# Patient Record
Sex: Female | Born: 1935 | Race: White | Hispanic: No | Smoking: Never smoker
Health system: Southern US, Community
[De-identification: ages and names within clinical notes are randomized; demographics above are authoritative.]

## PROBLEM LIST (undated history)

## (undated) DIAGNOSIS — I4891 Unspecified atrial fibrillation: Secondary | ICD-10-CM

## (undated) DIAGNOSIS — E039 Hypothyroidism, unspecified: Secondary | ICD-10-CM

## (undated) DIAGNOSIS — I10 Essential (primary) hypertension: Secondary | ICD-10-CM

## (undated) DIAGNOSIS — I5022 Chronic systolic (congestive) heart failure: Secondary | ICD-10-CM

## (undated) HISTORY — PX: LUMBAR FUSION: SHX111

## (undated) HISTORY — DX: Essential (primary) hypertension: I10

## (undated) HISTORY — DX: Hypothyroidism, unspecified: E03.9

## (undated) HISTORY — DX: Unspecified atrial fibrillation: I48.91

## (undated) HISTORY — PX: THYROIDECTOMY: SHX17

## (undated) HISTORY — PX: OTHER SURGICAL HISTORY: SHX169

## (undated) HISTORY — DX: Chronic systolic (congestive) heart failure: I50.22

---

## 2006-06-06 ENCOUNTER — Inpatient Hospital Stay (HOSPITAL_COMMUNITY): Admission: EM | Admit: 2006-06-06 | Discharge: 2006-06-15 | Payer: Self-pay | Admitting: Emergency Medicine

## 2006-06-13 ENCOUNTER — Encounter (INDEPENDENT_AMBULATORY_CARE_PROVIDER_SITE_OTHER): Payer: Self-pay | Admitting: *Deleted

## 2006-06-14 ENCOUNTER — Ambulatory Visit: Payer: Self-pay | Admitting: Internal Medicine

## 2007-10-02 ENCOUNTER — Ambulatory Visit: Payer: Self-pay | Admitting: Cardiology

## 2007-10-02 ENCOUNTER — Encounter: Payer: Self-pay | Admitting: Cardiology

## 2007-10-21 ENCOUNTER — Encounter: Payer: Self-pay | Admitting: Cardiology

## 2007-12-02 ENCOUNTER — Encounter: Payer: Self-pay | Admitting: Cardiology

## 2007-12-03 ENCOUNTER — Encounter: Payer: Self-pay | Admitting: Cardiology

## 2007-12-30 ENCOUNTER — Ambulatory Visit: Payer: Self-pay | Admitting: Cardiology

## 2007-12-31 ENCOUNTER — Ambulatory Visit: Payer: Self-pay | Admitting: Cardiology

## 2008-01-13 ENCOUNTER — Encounter: Payer: Self-pay | Admitting: Cardiology

## 2008-01-14 ENCOUNTER — Ambulatory Visit: Payer: Self-pay | Admitting: Cardiology

## 2008-01-27 ENCOUNTER — Ambulatory Visit: Payer: Self-pay | Admitting: Cardiology

## 2008-02-04 ENCOUNTER — Ambulatory Visit: Payer: Self-pay | Admitting: Cardiology

## 2008-02-06 ENCOUNTER — Ambulatory Visit: Payer: Self-pay | Admitting: Cardiology

## 2008-02-17 ENCOUNTER — Ambulatory Visit: Payer: Self-pay | Admitting: Cardiology

## 2008-02-18 ENCOUNTER — Ambulatory Visit: Payer: Self-pay | Admitting: Cardiology

## 2008-02-25 ENCOUNTER — Ambulatory Visit: Payer: Self-pay | Admitting: Cardiology

## 2008-03-13 ENCOUNTER — Ambulatory Visit: Payer: Self-pay | Admitting: Cardiology

## 2008-03-27 ENCOUNTER — Ambulatory Visit: Payer: Self-pay | Admitting: Cardiology

## 2008-04-13 ENCOUNTER — Encounter: Payer: Self-pay | Admitting: Cardiology

## 2008-04-17 ENCOUNTER — Ambulatory Visit: Payer: Self-pay | Admitting: Cardiology

## 2008-05-15 ENCOUNTER — Ambulatory Visit: Payer: Self-pay | Admitting: Cardiology

## 2008-05-29 ENCOUNTER — Ambulatory Visit: Payer: Self-pay | Admitting: Cardiology

## 2008-06-19 ENCOUNTER — Ambulatory Visit: Payer: Self-pay | Admitting: Cardiology

## 2008-07-03 ENCOUNTER — Ambulatory Visit: Payer: Self-pay | Admitting: Cardiology

## 2008-07-06 ENCOUNTER — Ambulatory Visit: Payer: Self-pay | Admitting: Cardiology

## 2008-07-21 ENCOUNTER — Ambulatory Visit: Payer: Self-pay | Admitting: Cardiology

## 2008-08-04 ENCOUNTER — Ambulatory Visit: Payer: Self-pay | Admitting: Cardiology

## 2008-08-18 ENCOUNTER — Ambulatory Visit: Payer: Self-pay | Admitting: Cardiology

## 2008-09-08 ENCOUNTER — Ambulatory Visit: Payer: Self-pay | Admitting: Cardiology

## 2008-10-02 ENCOUNTER — Ambulatory Visit: Payer: Self-pay | Admitting: Cardiology

## 2008-10-19 ENCOUNTER — Encounter: Payer: Self-pay | Admitting: *Deleted

## 2008-10-30 ENCOUNTER — Ambulatory Visit: Payer: Self-pay | Admitting: Internal Medicine

## 2008-11-03 DIAGNOSIS — I5042 Chronic combined systolic (congestive) and diastolic (congestive) heart failure: Secondary | ICD-10-CM | POA: Insufficient documentation

## 2008-11-03 DIAGNOSIS — I4891 Unspecified atrial fibrillation: Secondary | ICD-10-CM | POA: Insufficient documentation

## 2008-11-04 ENCOUNTER — Encounter: Payer: Self-pay | Admitting: Cardiology

## 2008-11-12 ENCOUNTER — Ambulatory Visit: Payer: Self-pay | Admitting: Cardiology

## 2008-11-27 ENCOUNTER — Ambulatory Visit: Payer: Self-pay | Admitting: Cardiology

## 2008-11-27 LAB — CONVERTED CEMR LAB: POC INR: 2.4

## 2009-01-01 ENCOUNTER — Ambulatory Visit: Payer: Self-pay | Admitting: Cardiology

## 2009-01-01 LAB — CONVERTED CEMR LAB: POC INR: 1.5

## 2009-01-12 ENCOUNTER — Ambulatory Visit: Payer: Self-pay | Admitting: Cardiology

## 2009-01-29 IMAGING — CR DG CHEST 2V
2 series · 2 of 2 positions shown · non-contrast
Comparison: Abdominal CT 06/06/06.

CLINICAL DATA: 89-year-old with nausea and abdominal pain. 
CHEST - 2 VIEW:

[w chest lat]
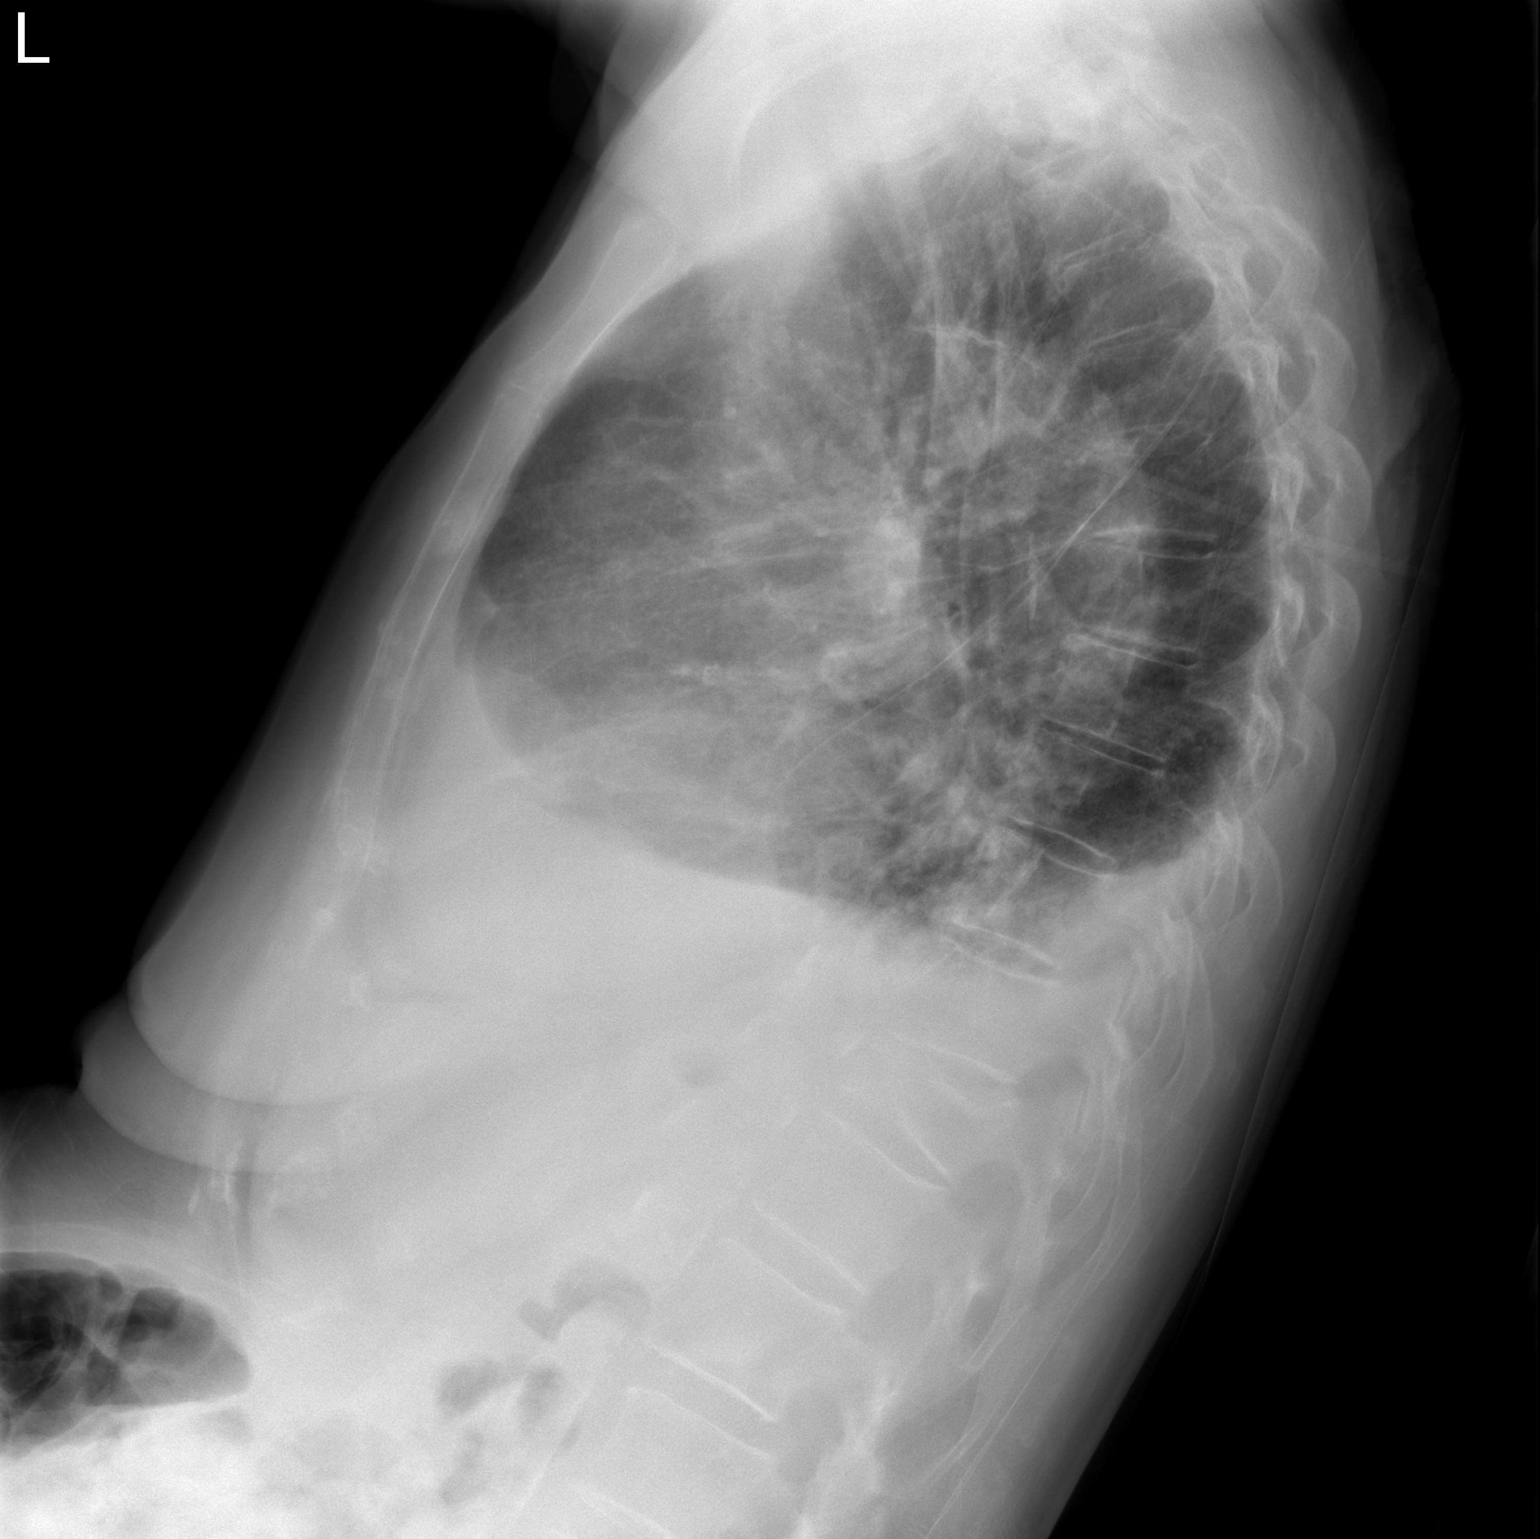

[view not recorded]
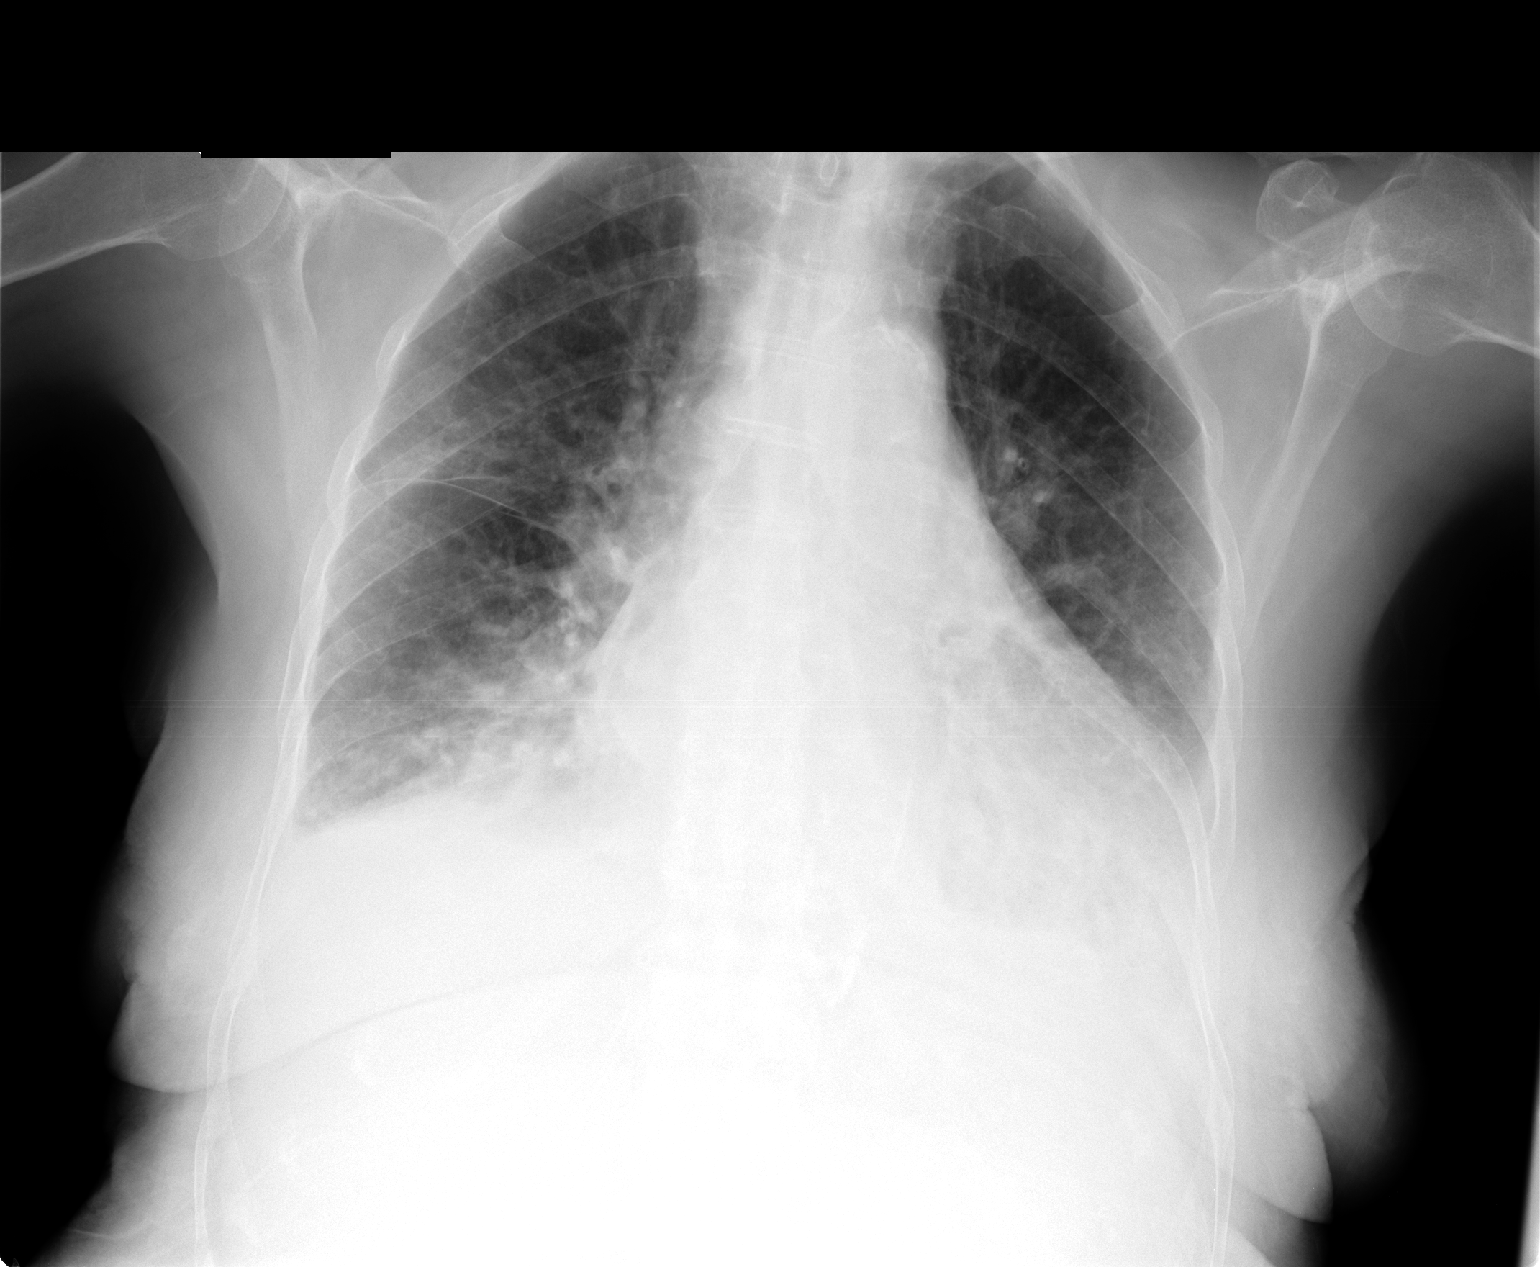

[2 of 2 positions shown; findings below may reference images not displayed]

FINDINGS: Two views of the chest demonstrate bilateral pleural effusions.  Interstitial markings are prominent and there is suggestion for some edema.  Heart size is also slightly enlarged.  Slightly increased density in the right upper lobe worrisome for a focus of infection.   There is a compression fracture involving T12.  Trachea is midline.
IMPRESSION: 1.  Bilateral pleural effusions and probable edema.  Interstitial and airspace disease in the right upper lung concerning for an area of infection and based on prior CT findings multifocal pneumonia cannot be excluded.
2.  Compression fracture involving T12.

## 2009-02-05 IMAGING — XA IR DG VERTEBROPLASTY FL
1 series · 9 of 9 positions shown · non-contrast
Comparison: none

CLINICAL DATA: Painful thoracic compression fracture.

[Series 1000: run · 0.08mm/px · 9 of 9 slices shown]
[im 1/9]
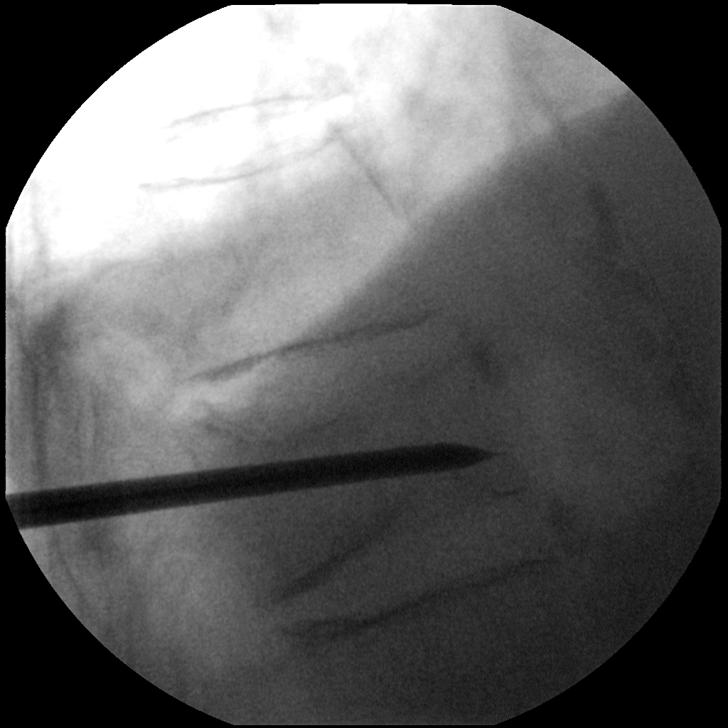
[im 2/9]
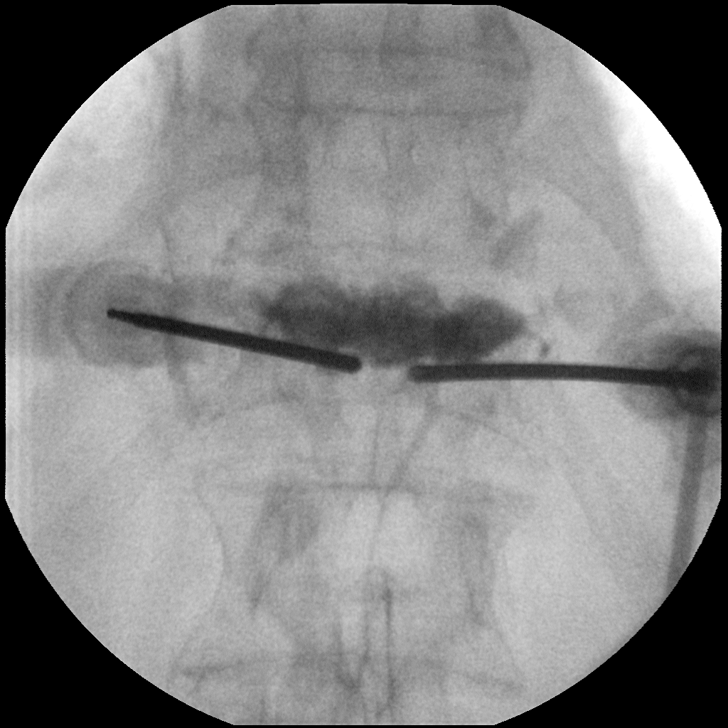
[im 3/9]
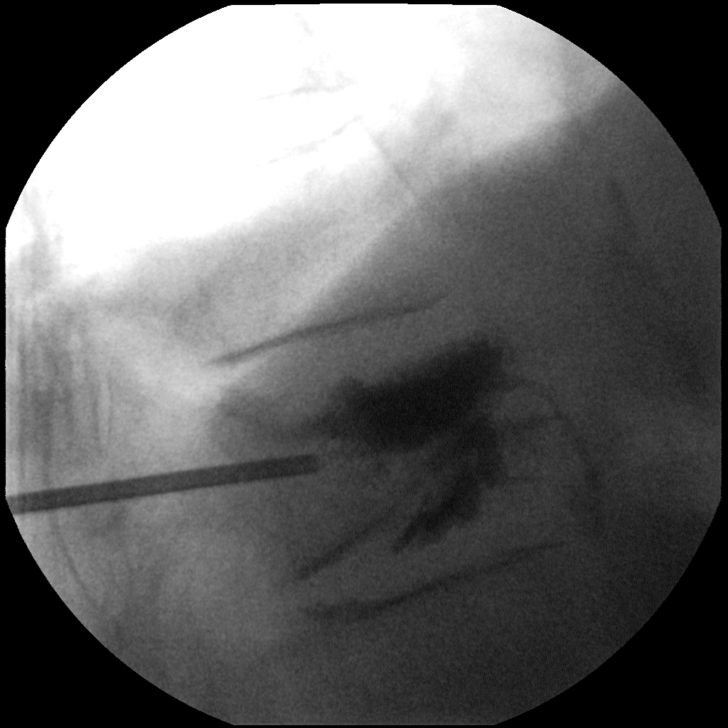
[im 4/9]
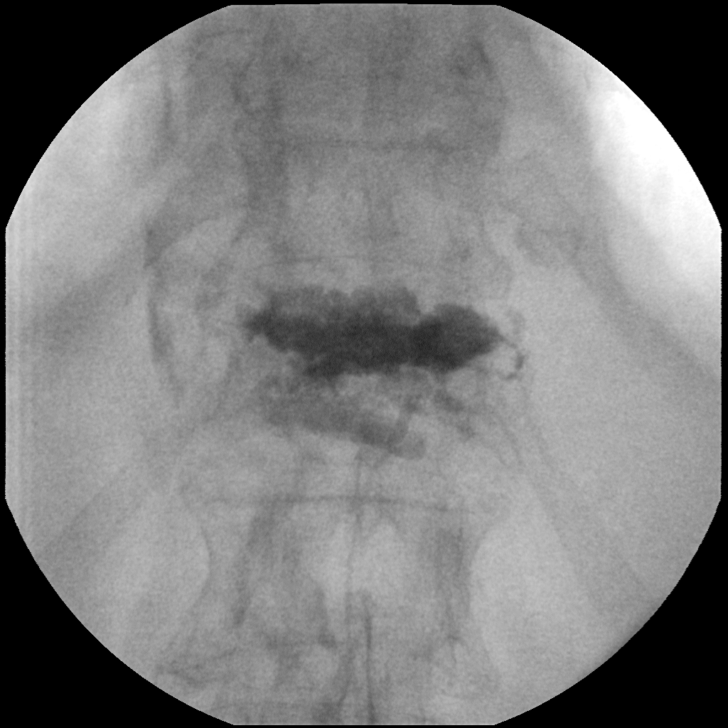
[im 5/9]
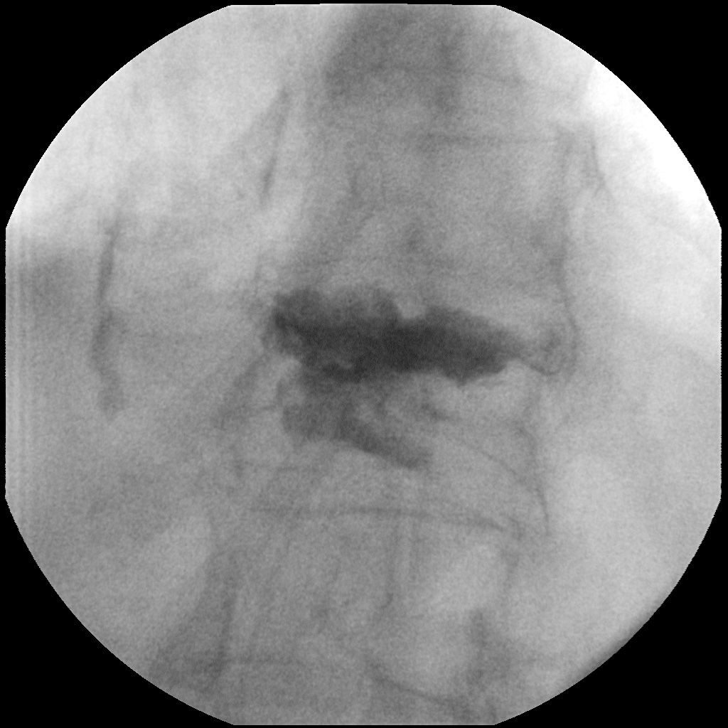
[im 6/9]
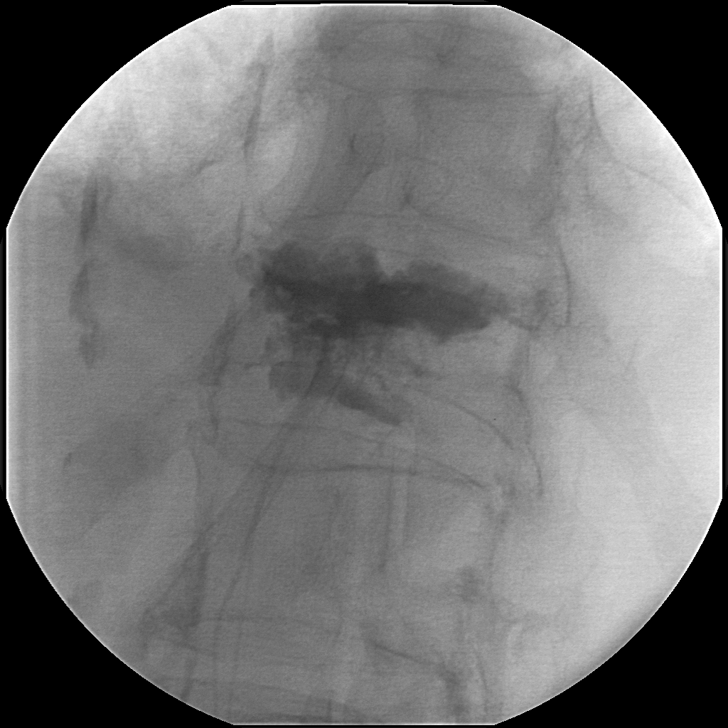
[im 7/9]
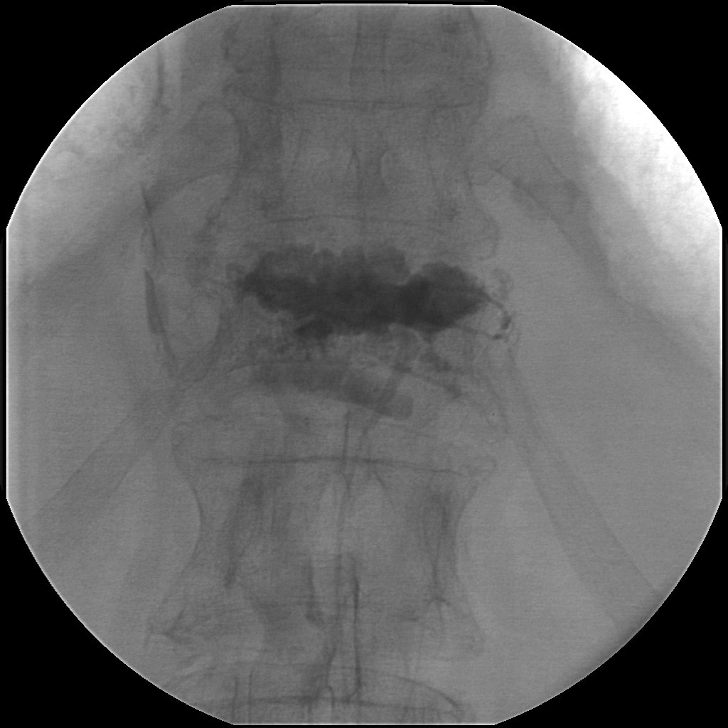
[im 8/9]
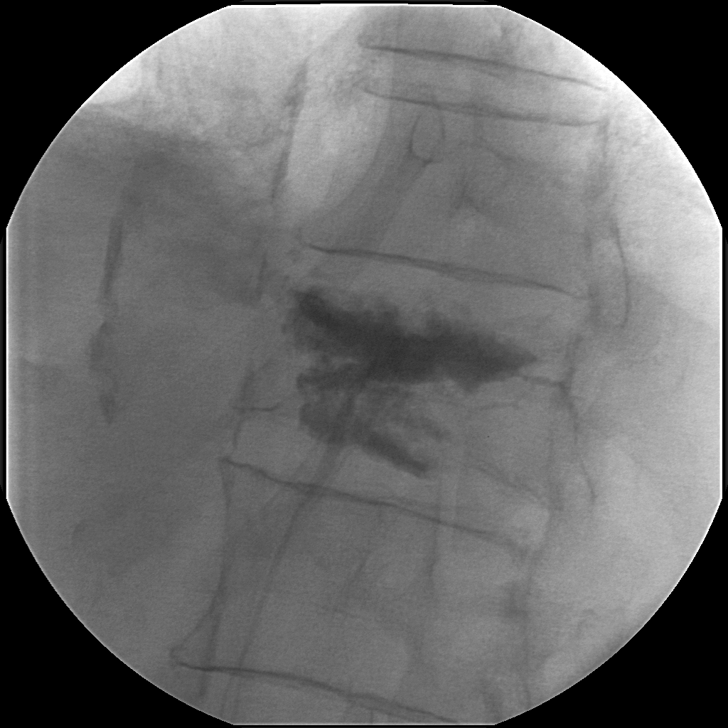
[im 9/9]
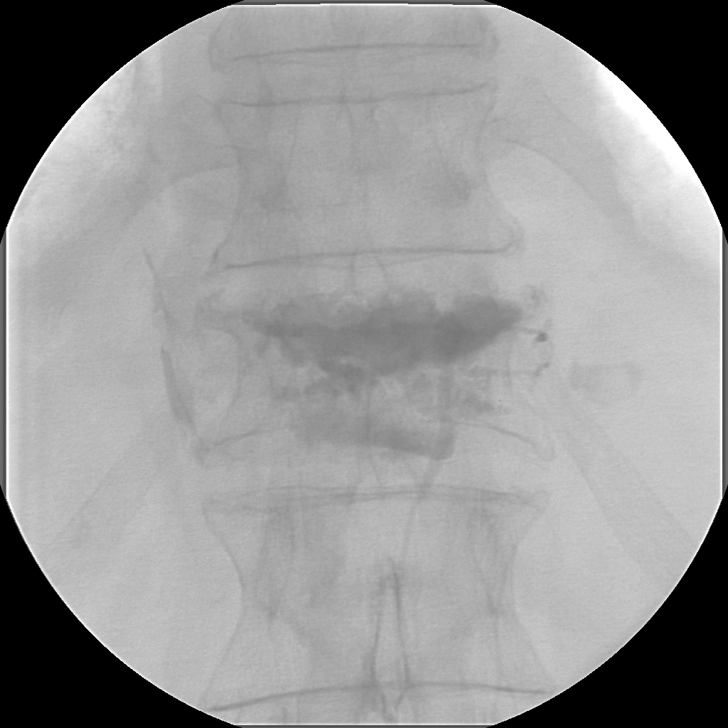

[9 of 9 positions shown; findings below may reference images not displayed]

Percutaneous thoracic vertebroplasty T12 :

An appropriate skin entry site was determined fluoroscopically. Skin site was
marked, prepped with Betadine, draped in usual sterile fashion, and infiltrated
locally with 1% lidocaine. Intravenous fentanyl and Versed were administered as
conscious sedation during continuous cardiorespiratory monitoring by the
radiology RN.
Under fluoroscopic guidance a Cook trocar needle was advanced to the posterior
third of the involved vertebral body using a right transpedicular approach. Core
bone biopsy sample  obtained. The needle was advanced to the anterior third of
the vertebral body near the midline. Second needle was placed using a left
transpedicular approach to the anterior third of the T12 vertebral body.
Prepared opacified methylmethacrylate cement was administered under fluoroscopic
visualization, with good filling from superior to inferior endplates and across
the midline. The trocar needles were removed. Patient tolerated the procedure
well, with no immediate complication.
IMPRESSION: 1. Technically successful thoracic vertebroplasty T12 .
2. Deep core bone biopsy T12 was obtained.

## 2009-02-12 ENCOUNTER — Ambulatory Visit: Payer: Self-pay | Admitting: Cardiology

## 2009-02-12 LAB — CONVERTED CEMR LAB: POC INR: 2.6

## 2009-03-12 ENCOUNTER — Ambulatory Visit: Payer: Self-pay | Admitting: Cardiology

## 2009-03-12 LAB — CONVERTED CEMR LAB: POC INR: 2.8

## 2009-04-09 ENCOUNTER — Ambulatory Visit: Payer: Self-pay | Admitting: Cardiology

## 2009-04-09 LAB — CONVERTED CEMR LAB: POC INR: 3.9

## 2009-04-16 ENCOUNTER — Encounter: Payer: Self-pay | Admitting: Cardiology

## 2009-04-30 ENCOUNTER — Ambulatory Visit: Payer: Self-pay | Admitting: Cardiology

## 2009-05-21 ENCOUNTER — Ambulatory Visit: Payer: Self-pay | Admitting: Cardiology

## 2009-06-11 ENCOUNTER — Ambulatory Visit: Payer: Self-pay | Admitting: Cardiology

## 2009-06-11 LAB — CONVERTED CEMR LAB: POC INR: 3.2

## 2009-07-13 ENCOUNTER — Ambulatory Visit: Payer: Self-pay | Admitting: Cardiology

## 2009-07-26 ENCOUNTER — Encounter: Payer: Self-pay | Admitting: Cardiology

## 2009-08-10 ENCOUNTER — Ambulatory Visit: Payer: Self-pay | Admitting: Cardiology

## 2009-09-10 ENCOUNTER — Ambulatory Visit: Payer: Self-pay | Admitting: Cardiology

## 2009-09-10 LAB — CONVERTED CEMR LAB: POC INR: 2

## 2009-10-15 ENCOUNTER — Ambulatory Visit: Payer: Self-pay | Admitting: Cardiology

## 2009-10-15 LAB — CONVERTED CEMR LAB: POC INR: 2.3

## 2009-11-12 ENCOUNTER — Ambulatory Visit: Payer: Self-pay | Admitting: Cardiology

## 2009-11-13 ENCOUNTER — Encounter: Payer: Self-pay | Admitting: Cardiology

## 2009-12-10 ENCOUNTER — Ambulatory Visit: Payer: Self-pay | Admitting: Cardiology

## 2009-12-10 LAB — CONVERTED CEMR LAB: POC INR: 2

## 2010-01-11 ENCOUNTER — Ambulatory Visit: Payer: Self-pay | Admitting: Cardiology

## 2010-01-11 LAB — CONVERTED CEMR LAB: POC INR: 2

## 2010-02-08 ENCOUNTER — Ambulatory Visit: Payer: Self-pay | Admitting: Cardiology

## 2010-02-08 LAB — CONVERTED CEMR LAB: POC INR: 1.6

## 2010-02-22 ENCOUNTER — Ambulatory Visit: Payer: Self-pay | Admitting: Cardiology

## 2010-03-17 ENCOUNTER — Ambulatory Visit: Admission: RE | Admit: 2010-03-17 | Discharge: 2010-03-17 | Payer: Self-pay | Source: Home / Self Care

## 2010-04-05 NOTE — Medication Information (Signed)
Summary: ccr-lr  Anticoagulant Therapy  Managed by: Vashti Hey, RN PCP: Dr. Samuel Jester Supervising MD: Myrtis Ser MD, Tinnie Gens Indication 1: Atrial Fibrillation (ICD-427.31) [facility] [place] Site: [facility] Clinic INR POC 3.5  Dietary changes: no    Health status changes: no    Bleeding/hemorrhagic complications: no    Recent/future hospitalizations: no    Any changes in medication regimen? no    Recent/future dental: no  Any missed doses?: no       Is patient compliant with meds? yes       Allergies: No Known Drug Allergies  Anticoagulation Management History:      The patient is taking warfarin and comes in today for a routine follow up visit.  Positive risk factors for bleeding include an age of 75 years or older.  The bleeding index is 'intermediate risk'.  Positive CHADS2 values include History of CHF and Age > 20 years old.  The start date was 12/31/2007.  Anticoagulation responsible provider: Myrtis Ser MD, Tinnie Gens.  INR POC: 3.5.  Cuvette Lot#: [id number].  Exp: 07/11.    Anticoagulation Management Assessment/Plan:      The patient's current anticoagulation dose is Warfarin sodium 5 mg tabs: Use as directed by Anticoagulation Clinic.  The target INR is 2 - 3.  The next INR is due 05/21/2009.  Anticoagulation instructions were given to daughter.  Results were reviewed/authorized by Vashti Hey, RN.  She was notified by Vashti Hey RN.         Prior Anticoagulation Instructions: INR 3.9 Hold coumadin tonight, thake 5mg  tomorrow night then resume 7.5mg  once daily except 5mg  on M,W,F  Current Anticoagulation Instructions: INR 3.5 Take coumadin 2.5mg  tonight then decrease dose to 5mg  once daily except 7.5mg  on Sundays and Thursdays

## 2010-04-05 NOTE — Medication Information (Signed)
Summary: ccr-lr  Anticoagulant Therapy  Managed by: Jasmine Hey, RN PCP: Jasmine Watkins Supervising Watkins: Jasmine Watkins, Remi Deter Indication 1: Atrial Fibrillation (ICD-427.31) Lab Used: [facility] [place] Site: [facility] Care Clinic INR POC 2.0  Dietary changes: no    Health status changes: no    Bleeding/hemorrhagic complications: no    Recent/future hospitalizations: no    Any changes in medication regimen? no    Recent/future dental: no  Any missed doses?: yes     Details: missed 1 dose 7/4  Is patient compliant with meds? yes       Allergies: No Known Drug Allergies  Anticoagulation Management History:      The patient is taking warfarin and comes in today for a routine follow up visit.  Positive risk factors for bleeding include an age of 75 years or older.  The bleeding index is 'intermediate risk'.  Positive CHADS2 values include History of CHF and Age > 62 years old.  The start date was 12/31/2007.  Anticoagulation responsible provider: Diona Browner Watkins, Remi Deter.  INR POC: 2.0.  Cuvette Lot#: [id number].  Exp: 07/11.    Anticoagulation Management Assessment/Plan:      The patient's current anticoagulation dose is Warfarin sodium 5 mg tabs: Use as directed by Anticoagulation Clinic.  The target INR is 2 - 3.  The next INR is due 10/15/2009.  Anticoagulation instructions were given to daughter.  Results were reviewed/authorized by Jasmine Hey, RN.  She was notified by Jasmine Hey RN.         Prior Anticoagulation Instructions: INR 2.0 Increase coumadin to 5mg  once daily except 7.5mg  on Tuesdays  Current Anticoagulation Instructions: INR 2.0 Take coumadin 7.5mg  tonight then resume 5mg  once daily except 7.5mg  on Tuesdays

## 2010-04-05 NOTE — Medication Information (Signed)
Summary: ccr-lr  Anticoagulant Therapy  Managed by: Vashti Hey, RN PCP: Dr. Samuel Jester Supervising MD: Andee Lineman MD, Michelle Piper Indication 1: Atrial Fibrillation (ICD-427.31) Lab Used: [facility] [place] Site: [facility] Care Clinic INR POC 2.0  Dietary changes: no    Health status changes: no    Bleeding/hemorrhagic complications: no    Recent/future hospitalizations: no    Any changes in medication regimen? no    Recent/future dental: no  Any missed doses?: no       Is patient compliant with meds? yes       Allergies: No Known Drug Allergies  Anticoagulation Management History:      The patient is taking warfarin and comes in today for a routine follow up visit.  Positive risk factors for bleeding include an age of 75 years or older.  The bleeding index is 'intermediate risk'.  Positive CHADS2 values include History of CHF and Age > 45 years old.  The start date was 12/31/2007.  Anticoagulation responsible provider: Andee Lineman MD, Michelle Piper.  INR POC: 2.0.  Cuvette Lot#: [id number].  Exp: 07/11.    Anticoagulation Management Assessment/Plan:      The patient's current anticoagulation dose is Warfarin sodium 5 mg tabs: Use as directed by Anticoagulation Clinic.  The target INR is 2 - 3.  The next INR is due 09/10/2009.  Anticoagulation instructions were given to daughter.  Results were reviewed/authorized by Vashti Hey, RN.  She was notified by Vashti Hey RN.         Prior Anticoagulation Instructions: INR 3.0 Decrease coumadin to 5 mg once daily   Current Anticoagulation Instructions: INR 2.0 Increase coumadin to 5mg  once daily except 7.5mg  on Tuesdays

## 2010-04-05 NOTE — Medication Information (Signed)
Summary: ccr-lr  Anticoagulant Therapy  Managed by: Jasmine Hey, RN PCP: Dr. Samuel Jester Supervising MD: Myrtis Ser MD, Tinnie Gens Indication 1: Atrial Fibrillation (ICD-427.31) Lab Used: [facility] [place] Site: [facility] of Care Clinic INR POC 3.4  Dietary changes: no    Health status changes: no    Bleeding/hemorrhagic complications: no    Recent/future hospitalizations: no    Any changes in medication regimen? yes       Details: was on prednisone taper for lung discomfort  Finished Monday this week  Recent/future dental: no  Any missed doses?: no       Is patient compliant with meds? yes       Allergies: No Known Drug Allergies  Anticoagulation Management History:      The patient is taking warfarin and comes in today for a routine follow up visit.  Positive risk factors for bleeding include an age of 75 years or older.  The bleeding index is 'intermediate risk'.  Positive CHADS2 values include History of CHF and Age > 30 years old.  The start date was 12/31/2007.  Anticoagulation responsible provider: Myrtis Ser MD, Tinnie Gens.  INR POC: 3.4.  Cuvette Lot#: [id number].  Exp: 07/11.    Anticoagulation Management Assessment/Plan:      The patient's current anticoagulation dose is Warfarin sodium 5 mg tabs: Use as directed by Anticoagulation Clinic.  The target INR is 2 - 3.  The next INR is due 06/11/2009.  Anticoagulation instructions were given to daughter.  Results were reviewed/authorized by Jasmine Hey, RN.  She was notified by Jasmine Hey RN.         Prior Anticoagulation Instructions: INR 3.5 Take coumadin 2.5mg  tonight then decrease dose to 5mg  once daily except 7.5mg  on Sundays and Thursdays  Current Anticoagulation Instructions: INR 3.4 Hold coumadin tonight then resume 5mg  once daily except 7.5mg  on Sundays and Thursdays Finished Prednisone Monday

## 2010-04-05 NOTE — Medication Information (Signed)
Summary: ccr-lr  Anticoagulant Therapy  Managed by: Vashti Hey, RN PCP: Dr. Samuel Jester Supervising MD: Antoine Poche MD, Fayrene Fearing Indication 1: Atrial Fibrillation (ICD-427.31) Lab Used: [facility] [place] Site: [facility] Care Clinic INR POC 1.6  Dietary changes: no    Health status changes: no    Bleeding/hemorrhagic complications: no    Recent/future hospitalizations: no    Any changes in medication regimen? no    Recent/future dental: no  Any missed doses?: no       Is patient compliant with meds? yes       Allergies: No Known Drug Allergies  Anticoagulation Management History:      The patient is taking warfarin and comes in today for a routine follow up visit.  Positive risk factors for bleeding include an age of 12 years or older.  The bleeding index is 'intermediate risk'.  Positive CHADS2 values include History of CHF and Age > 41 years old.  The start date was 12/31/2007.  Anticoagulation responsible Zahria Ding: Antoine Poche MD, Fayrene Fearing.  INR POC: 1.6.  Cuvette Lot#: [id number].  Exp: 07/11.    Anticoagulation Management Assessment/Plan:      The patient's current anticoagulation dose is Warfarin sodium 5 mg tabs: Use as directed by Anticoagulation Clinic.  The target INR is 2 - 3.  The next INR is due 02/22/2010.  Anticoagulation instructions were given to daughter.  Results were reviewed/authorized by Vashti Hey, RN.  She was notified by Vashti Hey RN.         Prior Anticoagulation Instructions: INR 2.0 Continue coumadin 5mg  once daily except 7.5mg  on Tuesdays  Current Anticoagulation Instructions: INR 1.6 Take coumadin 2 1/2 tablets tonight then increase dose to 1 tablet once daily except 1 1/2 tablets on T/Fri

## 2010-04-05 NOTE — Medication Information (Signed)
Summary: ccr-lr  Anticoagulant Therapy  Managed by: Vashti Hey, RN PCP: Dr. Samuel Jester Supervising MD: Diona Browner MD, Remi Deter Indication 1: Atrial Fibrillation (ICD-427.31) Lab Used: [facility] [place] Site: [facility] Clinic INR POC 2.8  Dietary changes: no    Health status changes: no    Bleeding/hemorrhagic complications: no    Recent/future hospitalizations: no    Any changes in medication regimen? no    Recent/future dental: no  Any missed doses?: no       Is patient compliant with meds? yes       Allergies: No Known Drug Allergies  Anticoagulation Management History:      The patient is taking warfarin and comes in today for a routine follow up visit.  Positive risk factors for bleeding include an age of 2 years or older.  The bleeding index is 'intermediate risk'.  Positive CHADS2 values include History of CHF and Age > 19 years old.  The start date was 12/31/2007.  Anticoagulation responsible provider: Diona Browner MD, Remi Deter.  INR POC: 2.8.  Cuvette Lot#: [id number].  Exp: 07/11.    Anticoagulation Management Assessment/Plan:      The patient's current anticoagulation dose is Warfarin sodium 5 mg tabs: Use as directed by Anticoagulation Clinic.  The target INR is 2 - 3.  The next INR is due 04/09/2009.  Anticoagulation instructions were given to daughter.  Results were reviewed/authorized by Vashti Hey, RN.  She was notified by Vashti Hey RN.         Prior Anticoagulation Instructions: INR 2.6 Continue coumadin 7.5mg  once daily except 5mg  on M,W,F  Current Anticoagulation Instructions: INR 2.8 Continue coumadin 7.5mg  once daily except 5mg  on M,W,F

## 2010-04-05 NOTE — Medication Information (Signed)
Summary: ccr-lr  Anticoagulant Therapy  Managed by: Vashti Hey, RN PCP: Dr. Samuel Jester Supervising MD: Diona Browner MD, Remi Deter Indication 1: Atrial Fibrillation (ICD-427.31) Lab Used: [facility] [place] Site: [facility] Care Clinic INR POC 3.0  Dietary changes: no    Health status changes: no    Bleeding/hemorrhagic complications: no    Recent/future hospitalizations: no    Any changes in medication regimen? no    Recent/future dental: no  Any missed doses?: no       Is patient compliant with meds? yes       Allergies: No Known Drug Allergies  Anticoagulation Management History:      Positive risk factors for bleeding include an age of 67 years or older.  The bleeding index is 'intermediate risk'.  Positive CHADS2 values include History of CHF and Age > 24 years old.  The start date was 12/31/2007.  Anticoagulation responsible provider: Diona Browner MD, Remi Deter.  INR POC: 3.0.  Exp: 07/11.    Anticoagulation Management Assessment/Plan:      The patient's current anticoagulation dose is Warfarin sodium 5 mg tabs: Use as directed by Anticoagulation Clinic.  The target INR is 2 - 3.  The next INR is due 08/10/2009.  Anticoagulation instructions were given to daughter.  Results were reviewed/authorized by Vashti Hey, RN.  She was notified by Vashti Hey RN.         Prior Anticoagulation Instructions: INR 3.2 Decrease coumadin to 5mg  once daily except 7.5mg  on Thursdays  Current Anticoagulation Instructions: INR 3.0 Decrease coumadin to 5 mg once daily

## 2010-04-05 NOTE — Medication Information (Signed)
Summary: ccr-lr  Anticoagulant Therapy  Managed by: Vashti Hey, RN PCP: Dr. Samuel Jester Supervising MD: Myrtis Ser MD, Tinnie Gens Indication 1: Atrial Fibrillation (ICD-427.31) Lab Used: [facility] [place] Site: [facility] Care Clinic INR POC 2.5  Dietary changes: no    Health status changes: no    Bleeding/hemorrhagic complications: no    Recent/future hospitalizations: no    Any changes in medication regimen? no    Recent/future dental: no  Any missed doses?: no       Is patient compliant with meds? yes       Allergies: No Known Drug Allergies  Anticoagulation Management History:      The patient is taking warfarin and comes in today for a routine follow up visit.  Positive risk factors for bleeding include an age of 75 years or older.  The bleeding index is 'intermediate risk'.  Positive CHADS2 values include History of CHF and Age > 66 years old.  The start date was 12/31/2007.  Anticoagulation responsible provider: Myrtis Ser MD, Tinnie Gens.  INR POC: 2.5.  Cuvette Lot#: [id number].  Exp: 07/11.    Anticoagulation Management Assessment/Plan:      The patient's current anticoagulation dose is Warfarin sodium 5 mg tabs: Use as directed by Anticoagulation Clinic.  The target INR is 2 - 3.  The next INR is due 12/10/2009.  Anticoagulation instructions were given to daughter.  Results were reviewed/authorized by Vashti Hey, RN.  She was notified by Vashti Hey RN.         Prior Anticoagulation Instructions: INR 2.3 Continue coumadin 5mg  once daily except 7.5mg  on Tuesdays  Current Anticoagulation Instructions: INR 2.5 Continue coumadin 5mg  once daily except 7.5mg  on Tuesdays

## 2010-04-05 NOTE — Medication Information (Signed)
Summary: ccr-at dr Diona Browner appt-lr  Anticoagulant Therapy  Managed by: Vashti Hey, RN PCP: Dr. Samuel Jester Supervising MD: Diona Browner MD, Remi Deter Indication 1: Atrial Fibrillation (ICD-427.31) Lab Used: [facility] [place] Site: [facility] Care Clinic INR POC 2.0  Dietary changes: no    Health status changes: no    Bleeding/hemorrhagic complications: no    Recent/future hospitalizations: no    Any changes in medication regimen? no    Recent/future dental: no  Any missed doses?: no       Is patient compliant with meds? yes       Allergies: No Known Drug Allergies  Anticoagulation Management History:      The patient is taking warfarin and comes in today for a routine follow up visit.  Positive risk factors for bleeding include an age of 75 years or older.  The bleeding index is 'intermediate risk'.  Positive CHADS2 values include History of CHF and Age > 75 years old.  The start date was 12/31/2007.  Anticoagulation responsible provider: Diona Browner MD, Remi Deter.  INR POC: 2.0.  Cuvette Lot#: [id number].  Exp: 07/11.    Anticoagulation Management Assessment/Plan:      The patient's current anticoagulation dose is Warfarin sodium 5 mg tabs: Use as directed by Anticoagulation Clinic.  The target INR is 2 - 3.  The next INR is due 02/08/2010.  Anticoagulation instructions were given to daughter.  Results were reviewed/authorized by Vashti Hey, RN.  She was notified by Vashti Hey RN.         Prior Anticoagulation Instructions: INR 2.0 Take coumadin 7.5mg  tonight then resume 5mg  once daily except 7.5mg  on Tuesdays  Current Anticoagulation Instructions: INR 2.0 Continue coumadin 5mg  once daily except 7.5mg  on Tuesdays

## 2010-04-05 NOTE — Medication Information (Signed)
Summary: ccr-lr  Anticoagulant Therapy  Managed by: Vashti Hey, RN PCP: Dr. Natacia Chaisson Jester Supervising MD: Diona Browner MD, Remi Deter Indication 1: Atrial Fibrillation (ICD-427.31) Lab Used: [facility] [place] Site: [facility] Clinic INR POC 3.9  Dietary changes: no    Health status changes: no    Bleeding/hemorrhagic complications: no    Recent/future hospitalizations: no    Any changes in medication regimen? no    Recent/future dental: no  Any missed doses?: no       Is patient compliant with meds? yes       Allergies: No Known Drug Allergies  Anticoagulation Management History:      The patient is taking warfarin and comes in today for a routine follow up visit.  Positive risk factors for bleeding include an age of 75 years or older.  The bleeding index is 'intermediate risk'.  Positive CHADS2 values include History of CHF and Age > 36 years old.  The start date was 12/31/2007.  Anticoagulation responsible provider: Diona Browner MD, Remi Deter.  INR POC: 3.9.  Cuvette Lot#: [id number].  Exp: 07/11.    Anticoagulation Management Assessment/Plan:      The patient's current anticoagulation dose is Warfarin sodium 5 mg tabs: Use as directed by Anticoagulation Clinic.  The target INR is 2 - 3.  The next INR is due 04/30/2009.  Anticoagulation instructions were given to daughter.  Results were reviewed/authorized by Vashti Hey, RN.  She was notified by Vashti Hey RN.         Prior Anticoagulation Instructions: INR 2.8 Continue coumadin 7.5mg  once daily except 5mg  on M,W,F  Current Anticoagulation Instructions: INR 3.9 Hold coumadin tonight, thake 5mg  tomorrow night then resume 7.5mg  once daily except 5mg  on M,W,F

## 2010-04-05 NOTE — Assessment & Plan Note (Signed)
Summary: 3 MOFU PER DEC REMINDER-SRS  Medications Added FERROUS SULFATE 325 (65 FE) MG TABS (FERROUS SULFATE) Take 1 tablet by mouth two times a day CALTRATE 600+D 600-400 MG-UNIT TABS (CALCIUM CARBONATE-VITAMIN D) Take 1 tablet by mouth two times a day DIOVAN 80 MG TABS (VALSARTAN) Take 1 tablet by mouth once a day      Allergies Added: NKDA  Visit Type:  Follow-up Primary Provider:  Dr. Leotha Westermeyer Jester   History of Present Illness: 77 year old woman presents for a followup visit. She is doing quite well since I last saw her. She did have eye surgery with conscious sedation at [facility] in the interim. Her daughters, both present today, indicate that she is doing very well, with good appetite, no significant edema, and good energy.  She denies any palpitations. She describes NYHA class II dyspnea on exertion. She uses a cane to ambulate. No falls.  Preventive Screening-Counseling & Management  Alcohol-Tobacco     Smoking Status: never  Current Medications (verified): 1)  Ferrous Sulfate 325 (65 Fe) Mg Tabs (Ferrous Sulfate) .... Take 1 Tablet By Mouth Two Times A Day 2)  Synthroid 75 Mcg Tabs (Levothyroxine Sodium) .... Take 1 Tablet By Mouth Once A Day 3)  Calcitonin (Salmon) 200 Unit/act Soln (Calcitonin (Salmon)) .... Once Daily 4)  Timolol Maleate 0.5 % Soln (Timolol Maleate) .... Once Daily 5)  Warfarin Sodium 5 Mg Tabs (Warfarin Sodium) .... Use As Directed By Anticoagulation Clinic 6)  Caltrate 600+d 600-400 Mg-Unit Tabs (Calcium Carbonate-Vitamin D) .... Take 1 Tablet By Mouth Two Times A Day 7)  Carvedilol 25 Mg Tabs (Carvedilol) .... Take One Tablet By Mouth Twice A Day 8)  Digoxin 0.125 Mg Tabs (Digoxin) .... Take A Half  Tablet By Mouth Daily 9)  Furosemide 80 Mg Tabs (Furosemide) .... Take One Tablet By Mouth Two Times A Day 10)  Diovan 80 Mg Tabs (Valsartan) .... Take 1 Tablet By Mouth Once A Day  Allergies (verified): No Known Drug Allergies  Past History:  Past  Medical History: Last updated: 11/12/2008 Atrial Fibrillation Congestive Heart Failure Hypertension Hypothyroidism Gout  Social History: Last updated: 11/12/2008 Widowed  Tobacco Use - No.  Alcohol Use - no Drug Use - no  Review of Systems  The patient denies anorexia, fever, vision loss, chest pain, syncope, hemoptysis, abdominal pain, melena, and hematochezia.         Otherwise reviewed and negative.  Vital Signs:  Patient profile:   77 year old female Height:      65 inches Weight:      117 pounds Pulse rate:   87 / minute BP sitting:   111 / 74  (left arm) Cuff size:   regular  Vitals Entered By: Carlye Grippe (March 12, 2009 1:27 PM)   Physical Exam  Additional Exam:  Thin elderly woman in no acute distress. Hard of hearing. HEENT: Conjunctiva and lids normal, oropharynx with moist mucosa. Neck: Supple, no elevated venous pressure. Lungs: Clear to auscultation, nonlabored breathing. Cardiac: Irregularly irregular. Indistinct PMI. No S3 gallop. Extremities: Trace ankle edema, nonpitting. Distal pulses one plus. Skin: Warm and dry.   EKG  Procedure date:  03/12/2009  Findings:      Atrial fibrillation at 75 beats per minute with leftward axis and aberrantly conducted beats.  Impression & Recommendations:  Problem # 1:  ATRIAL FIBRILLATION (ICD-427.31)  Symptomatically stable on present medical regimen. Patient is tolerating Coumadin without bleeding problems, and has had no falls. Followup will be  arranged in the next 4 months.  Her updated medication list for this problem includes:    Warfarin Sodium 5 Mg Tabs (Warfarin sodium) ..... Use as directed by anticoagulation clinic    Carvedilol 25 Mg Tabs (Carvedilol) .Marland Kitchen... Take one tablet by mouth twice a day    Digoxin 0.125 Mg Tabs (Digoxin) .Marland Kitchen... Take a half  tablet by mouth daily  Orders: EKG w/ Interpretation (93000)  Problem # 2:  SYSTOLIC HEART FAILURE, CHRONIC (ICD-428.22)  Clinically  stable without evidence of volume overload or significant lower extremity edema. Patient's appetite is good, and her weight has not been fluctuating significantly. Conservative medical therapy will continue.  Her updated medication list for this problem includes:    Warfarin Sodium 5 Mg Tabs (Warfarin sodium) ..... Use as directed by anticoagulation clinic    Carvedilol 25 Mg Tabs (Carvedilol) .Marland Kitchen... Take one tablet by mouth twice a day    Digoxin 0.125 Mg Tabs (Digoxin) .Marland Kitchen... Take a half  tablet by mouth daily    Furosemide 80 Mg Tabs (Furosemide) .Marland Kitchen... Take one tablet by mouth two times a day    Diovan 80 Mg Tabs (Valsartan) .Marland Kitchen... Take 1 tablet by mouth once a day  Patient Instructions: 1)  Your physician recommends that you continue on your current medications as directed. Please refer to the Current Medication list given to you today. 2)  Follow up in  4 months.

## 2010-04-05 NOTE — Medication Information (Signed)
Summary: ccr-lr  Anticoagulant Therapy  Managed by: Vashti Hey, RN PCP: Dr. Samuel Jester Supervising MD: Antoine Poche MD, Fayrene Fearing Indication 1: Atrial Fibrillation (ICD-427.31) [facility] [place] Site: [facility] INR POC 2.0  Dietary changes: no    Health status changes: no    Bleeding/hemorrhagic complications: no    Recent/future hospitalizations: no    Any changes in medication regimen? no    Recent/future dental: no  Any missed doses?: no       Is patient compliant with meds? yes       Allergies: No Known Drug Allergies  Anticoagulation Management History:      The patient is taking warfarin and comes in today for a routine follow up visit.  Positive risk factors for bleeding include an age of 75 years or older.  The bleeding index is 'intermediate risk'.  Positive CHADS2 values include History of CHF and Age > 65 years old.  The start date was 12/31/2007.  Anticoagulation responsible Mikia Delaluz: Antoine Poche MD, Fayrene Fearing.  INR POC: 2.0.  Cuvette Lot#: [id number].  Exp: 07/11.    Anticoagulation Management Assessment/Plan:      The patient's current anticoagulation dose is Warfarin sodium 5 mg tabs: Use as directed by Anticoagulation Clinic.  The target INR is 2 - 3.  The next INR is due 01/11/2010.  Anticoagulation instructions were given to daughter.  Results were reviewed/authorized by Vashti Hey, RN.  She was notified by Vashti Hey RN.         Prior Anticoagulation Instructions: INR 2.5 Continue coumadin 5mg  once daily except 7.5mg  on Tuesdays  Current Anticoagulation Instructions: INR 2.0 Take coumadin 7.5mg  tonight then resume 5mg  once daily except 7.5mg  on Tuesdays

## 2010-04-05 NOTE — Assessment & Plan Note (Signed)
Summary: 4 mo fu reminder-srs  Medications Added FISH OIL 1200 MG CAPS (OMEGA-3 FATTY ACIDS) Take 1 tablet by mouth once a day      Allergies Added: NKDA  Visit Type:  Follow-up Primary Provider:  Dr. Laurine Kuyper Jester   History of Present Illness: 77 year old woman presents for followup with her 2 daughters. She seems to be doing fairly well. She denies any unusual chest pain or palpitations, and her daughters confirm that she has not made any complaints as such. She has pending left-sided cataract surgery this month. She has had no obvious bleeding problems on Coumadin.  She reports no problems with lower extremity edema or shortness of breath at nighttime.  Preventive Screening-Counseling & Management  Alcohol-Tobacco     Smoking Status: never  Current Medications (verified): 1)  Ferrous Sulfate 325 (65 Fe) Mg Tabs (Ferrous Sulfate) .... Take 1 Tablet By Mouth Two Times A Day 2)  Synthroid 75 Mcg Tabs (Levothyroxine Sodium) .... Take 1 Tablet By Mouth Once A Day 3)  Calcitonin (Salmon) 200 Unit/act Soln (Calcitonin (Salmon)) .... Once Daily 4)  Timolol Maleate 0.5 % Soln (Timolol Maleate) .... Once Daily 5)  Warfarin Sodium 5 Mg Tabs (Warfarin Sodium) .... Use As Directed By Anticoagulation Clinic 6)  Caltrate 600+d 600-400 Mg-Unit Tabs (Calcium Carbonate-Vitamin D) .... Take 1 Tablet By Mouth Two Times A Day 7)  Carvedilol 25 Mg Tabs (Carvedilol) .... Take One Tablet By Mouth Twice A Day 8)  Digoxin 0.125 Mg Tabs (Digoxin) .... Take A Half  Tablet By Mouth Daily 9)  Furosemide 80 Mg Tabs (Furosemide) .... Take One Tablet By Mouth Two Times A Day 10)  Diovan 80 Mg Tabs (Valsartan) .... Take 1 Tablet By Mouth Once A Day 11)  Fish Oil 1200 Mg Caps (Omega-3 Fatty Acids) .... Take 1 Tablet By Mouth Once A Day  Allergies (verified): No Known Drug Allergies  Comments:  Nurse/Medical Assistant: The patient's medications and allergies were reviewed with the patient and were updated  in the Medication and Allergy Lists. Bottles reviewed.  Past History:  Social History: Last updated: 11/12/2008 Widowed  Tobacco Use - No.  Alcohol Use - no Drug Use - no  Past Medical History: Atrial Fibrillation Congestive Heart Failure, LVEF 45-50% Hypertension Hypothyroidism Gout  Clinical Review Panels:  Echocardiogram Echocardiogram 1. The estimated ejection fraction is 45-50%.          2. The  basal inferolateral, basal inferior and basal inferoseptal         wall segments are hypokinetic.          3. The left atrium is mild to moderately dilated.          4. The right atrium is mildly dilated.          5. There is very mild aortic stenosis.         6. There is mild to moderate mitral regurgitation.          7. There is mild to moderate tricuspid regurgitation.         8. There is evidence of mild pulmonary hypertension.         9. There is a small pericardial effusion.         10. There is a large pleural effusion.         11. The inferior vena cava appears normal.         12. There is a greater than 50% respiratory change in the inferior  vena cava dimension. (10/02/2007)    Review of Systems  The patient denies anorexia, fever, weight gain, syncope, peripheral edema, prolonged cough, hemoptysis, melena, and hematochezia.         Otherwise reviewed and negative.  Vital Signs:  Patient profile:   77 year old female Height:      65 inches Weight:      112 pounds BMI:     18.71 Pulse rate:   80 / minute BP sitting:   102 / 65  (left arm) Cuff size:   regular  Vitals Entered By: Carlye Grippe (Jul 13, 2009 2:21 PM)  Physical Exam  Additional Exam:  Thin elderly woman in no acute distress. Hard of hearing. HEENT: Conjunctiva and lids normal, oropharynx with moist mucosa. Neck: Supple, no elevated venous pressure. Lungs: Clear to auscultation, nonlabored breathing. Cardiac: Irregularly irregular. Indistinct PMI. No S3 gallop. Extremities:  Trace ankle edema, nonpitting. Distal pulses one plus. Skin: Warm and dry.   EKG  Procedure date:  07/13/2009  Findings:      Atrial fibrillation at 68 beats per minute with leftward axis, question old infarct inferiorly, poor R-wave progression with nonspecific ST changes.  Impression & Recommendations:  Problem # 1:  ATRIAL FIBRILLATION (ICD-427.31)  Symptomatically stable, rate controlled, on Coumadin, carvedilol, and digoxin. Followup planned for 6 months.   Her updated medication list for this problem includes:    Warfarin Sodium 5 Mg Tabs (Warfarin sodium) ..... Use as directed by anticoagulation clinic    Carvedilol 25 Mg Tabs (Carvedilol) .Marland Kitchen... Take one tablet by mouth twice a day    Digoxin 0.125 Mg Tabs (Digoxin) .Marland Kitchen... Take a half  tablet by mouth daily  Orders: EKG w/ Interpretation (93000)  Problem # 2:  SYSTOLIC HEART FAILURE, CHRONIC (ICD-428.22)  Stable overall, weight down 5 pounds since last visit, on medical therapy. No obvious signs of volume overload.  Her updated medication list for this problem includes:    Warfarin Sodium 5 Mg Tabs (Warfarin sodium) ..... Use as directed by anticoagulation clinic    Carvedilol 25 Mg Tabs (Carvedilol) .Marland Kitchen... Take one tablet by mouth twice a day    Digoxin 0.125 Mg Tabs (Digoxin) .Marland Kitchen... Take a half  tablet by mouth daily    Furosemide 80 Mg Tabs (Furosemide) .Marland Kitchen... Take one tablet by mouth two times a day    Diovan 80 Mg Tabs (Valsartan) .Marland Kitchen... Take 1 tablet by mouth once a day  Patient Instructions: 1)  Your physician wants you to follow-up in: 6 months. You will receive a reminder letter in the mail one-two months in advance. If you don't receive a letter, please call our office to schedule the follow-up appointment. 2)  Your physician recommends that you continue on your current medications as directed. Please refer to the Current Medication list given to you today.

## 2010-04-05 NOTE — Medication Information (Signed)
Summary: ccr-lr  Anticoagulant Therapy  Managed by: Vashti Hey, RN PCP: Dr. Melody Cirrincione Jester Supervising MD: Diona Browner MD, Remi Deter Indication 1: Atrial Fibrillation (ICD-427.31) [facility]  Site: [facility] Clinic INR POC 3.2  Dietary changes: no    Health status changes: no    Bleeding/hemorrhagic complications: no    Recent/future hospitalizations: no    Any changes in medication regimen? no    Recent/future dental: no  Any missed doses?: no       Is patient compliant with meds? yes       Allergies: No Known Drug Allergies  Anticoagulation Management History:      The patient is taking warfarin and comes in today for a routine follow up visit.  Positive risk factors for bleeding include an age of 75 years or older.  The bleeding index is 'intermediate risk'.  Positive CHADS2 values include History of CHF and Age > 73 years old.  The start date was 12/31/2007.  Anticoagulation responsible provider: Diona Browner MD, Remi Deter.  INR POC: 3.2.  Cuvette Lot#: [id number].  Exp: 07/11.    Anticoagulation Management Assessment/Plan:      The patient's current anticoagulation dose is Warfarin sodium 5 mg tabs: Use as directed by Anticoagulation Clinic.  The target INR is 2 - 3.  The next INR is due 07/09/2009.  Anticoagulation instructions were given to daughter.  Results were reviewed/authorized by Vashti Hey, RN.  She was notified by Vashti Hey RN.         Prior Anticoagulation Instructions: INR 3.4 Hold coumadin tonight then resume 5mg  once daily except 7.5mg  on Sundays and Thursdays Finished Prednisone Monday  Current Anticoagulation Instructions: INR 3.2 Decrease coumadin to 5mg  once daily except 7.5mg  on Thursdays

## 2010-04-05 NOTE — Assessment & Plan Note (Signed)
Summary: 6 mo ful  Medications Added FERROUS SULFATE 325 (65 FE) MG TABS (FERROUS SULFATE) Take 1 tablet by mouth daily TIMOLOL MALEATE 0.5 % SOLN (TIMOLOL MALEATE) use as directed      Allergies Added: NKDA  Visit Type:  Follow-up Primary Provider:  Dr. Samuel Jester   History of Present Illness: 77 year old woman presents for followup. She was seen back in May. She is here with her 2 daughters.  She was seen in the [facility] ED back in September with chest pain. Labs from 10 September showed BUN 36, creatinine 1.6, sodium 139, potassium 3.9, CK 70, troponin I 0.03, TSH 1.5, INR 2.4, BNP 732, hemoglobin 11.5, platelets 143, digoxin 1.2. ECG at that time showed atrial fibrillation at 84 beats per minute with occasional aberrant conduction and poor R-wave progression. She was discharged, and was seen by primary care in followup, without any progression of symptoms.  She denies any recent chest pain. No significant palpitations. Has stable fatigue with activity. No falls or reported bleeding problems on Coumadin.   Clinical Review Panels:  Echocardiogram Echocardiogram 1. The estimated ejection fraction is 45-50%.          2. The  basal inferolateral, basal inferior and basal inferoseptal         wall segments are hypokinetic.          3. The left atrium is mild to moderately dilated.          4. The right atrium is mildly dilated.          5. There is very mild aortic stenosis.         6. There is mild to moderate mitral regurgitation.          7. There is mild to moderate tricuspid regurgitation.         8. There is evidence of mild pulmonary hypertension.         9. There is a small pericardial effusion.         10. There is a large pleural effusion.         11. The inferior vena cava appears normal.         12. There is a greater than 50% respiratory change in the inferior         vena cava dimension. (10/02/2007)    Preventive Screening-Counseling &  Management  Alcohol-Tobacco     Smoking Status: never  Current Medications (verified): 1)  Ferrous Sulfate 325 (65 Fe) Mg Tabs (Ferrous Sulfate) .... Take 1 Tablet By Mouth Daily 2)  Synthroid 75 Mcg Tabs (Levothyroxine Sodium) .... Take 1 Tablet By Mouth Once A Day 3)  Calcitonin (Salmon) 200 Unit/act Soln (Calcitonin (Salmon)) .... Once Daily 4)  Timolol Maleate 0.5 % Soln (Timolol Maleate) .... Use As Directed 5)  Warfarin Sodium 5 Mg Tabs (Warfarin Sodium) .... Use As Directed By Anticoagulation Clinic 6)  Caltrate 600+d 600-400 Mg-Unit Tabs (Calcium Carbonate-Vitamin D) .... Take 1 Tablet By Mouth Two Times A Day 7)  Carvedilol 25 Mg Tabs (Carvedilol) .... Take One Tablet By Mouth Twice A Day 8)  Digoxin 0.125 Mg Tabs (Digoxin) .... Take A Half  Tablet By Mouth Daily 9)  Furosemide 80 Mg Tabs (Furosemide) .... Take One Tablet By Mouth Two Times A Day 10)  Diovan 80 Mg Tabs (Valsartan) .... Take 1 Tablet By Mouth Once A Day  Allergies (verified): No Known Drug Allergies  Comments:  Nurse/Medical Assistant: The patient's medication bottles and allergies  were reviewed with the patient and were updated in the Medication and Allergy Lists.  Past History:  Past Medical History: Last updated: 07/13/2009 Atrial Fibrillation Congestive Heart Failure, LVEF 45-50% Hypertension Hypothyroidism Gout  Social History: Last updated: 11/12/2008 Widowed  Tobacco Use - No.  Alcohol Use - no Drug Use - no  Review of Systems  The patient denies anorexia, fever, weight gain, chest pain, syncope, peripheral edema, headaches, melena, and hematochezia.         Otherwise reviewed and negative except as outlined.  Vital Signs:  Patient profile:   77 year old female Height:      65 inches Weight:      111 pounds Pulse rate:   88 / minute BP sitting:   95 / 59  (left arm) Cuff size:   regular  Vitals Entered By: Carlye Grippe (January 11, 2010 1:44 PM)  Physical Exam  Additional  Exam:  Thin elderly woman in no acute distress. Hard of hearing. HEENT: Conjunctiva and lids normal, oropharynx with moist mucosa. Neck: Supple, no elevated venous pressure. Lungs: Clear to auscultation, nonlabored breathing. Cardiac: Irregularly irregular. Indistinct PMI. No S3 gallop. Extremities: Trace ankle edema, nonpitting. Distal pulses one plus. Skin: Warm and dry.   EKG  Procedure date:  01/11/2010  Findings:      Atrial fibrillation at 100 beats per minute with occasional aberrant conduction, leftward axis, decreased R wave progression.  Impression & Recommendations:  Problem # 1:  ATRIAL FIBRILLATION (ICD-427.31)  Symptomatically stable, rate adequately controlled at rest. No reported bleeding problems on Coumadin. She continues with regular followup in our Coumadin clinic. Followup scheduled for 6 months  Her updated medication list for this problem includes:    Warfarin Sodium 5 Mg Tabs (Warfarin sodium) ..... Use as directed by anticoagulation clinic    Carvedilol 25 Mg Tabs (Carvedilol) .Marland Kitchen... Take one tablet by mouth twice a day    Digoxin 0.125 Mg Tabs (Digoxin) .Marland Kitchen... Take a half  tablet by mouth daily  Orders: EKG w/ Interpretation (93000)  Problem # 2:  SYSTOLIC HEART FAILURE, CHRONIC (ICD-428.22)  Appears euvolemic, weight stable. No progressive shortness of breath.  Her updated medication list for this problem includes:    Warfarin Sodium 5 Mg Tabs (Warfarin sodium) ..... Use as directed by anticoagulation clinic    Carvedilol 25 Mg Tabs (Carvedilol) .Marland Kitchen... Take one tablet by mouth twice a day    Digoxin 0.125 Mg Tabs (Digoxin) .Marland Kitchen... Take a half  tablet by mouth daily    Furosemide 80 Mg Tabs (Furosemide) .Marland Kitchen... Take one tablet by mouth two times a day    Diovan 80 Mg Tabs (Valsartan) .Marland Kitchen... Take 1 tablet by mouth once a day  Patient Instructions: 1)  Your physician wants you to follow-up in: 6 months. You will receive a reminder letter in the mail one-two  months in advance. If you don't receive a letter, please call our office to schedule the follow-up appointment. 2)  Your physician recommends that you continue on your current medications as directed. Please refer to the Current Medication list given to you today.

## 2010-04-05 NOTE — Medication Information (Signed)
Summary: ccr-lr  Anticoagulant Therapy  Managed by: Vashti Hey, RN PCP: Dr. Samuel Jester Supervising MD: Myrtis Ser MD, Tinnie Gens Indication 1: Atrial Fibrillation (ICD-427.31) Lab [facility]: [facility] [place] Site: [facility] Clinic INR POC 2.3  Dietary changes: no    Health status changes: no    Bleeding/hemorrhagic complications: no    Recent/future hospitalizations: no    Any changes in medication regimen? no    Recent/future dental: no  Any missed doses?: yes     Details: missed 1 dose 3 wks ago  Is patient compliant with meds? yes       Allergies: No Known Drug Allergies  Anticoagulation Management History:      The patient is taking warfarin and comes in today for a routine follow up visit.  Positive risk factors for bleeding include an age of 75 years or older.  The bleeding index is 'intermediate risk'.  Positive CHADS2 values include History of CHF and Age > 59 years old.  The start date was 12/31/2007.  Anticoagulation responsible provider: Myrtis Ser MD, Tinnie Gens.  INR POC: 2.3.  Exp: 07/11.    Anticoagulation Management Assessment/Plan:      The patient's current anticoagulation dose is Warfarin sodium 5 mg tabs: Use as directed by Anticoagulation Clinic.  The target INR is 2 - 3.  The next INR is due 11/12/2009.  Anticoagulation instructions were given to daughter.  Results were reviewed/authorized by Vashti Hey, RN.  She was notified by Vashti Hey RN.         Prior Anticoagulation Instructions: INR 2.0 Take coumadin 7.5mg  tonight then resume 5mg  once daily except 7.5mg  on Tuesdays  Current Anticoagulation Instructions: INR 2.3 Continue coumadin 5mg  once daily except 7.5mg  on Tuesdays

## 2010-04-07 NOTE — Medication Information (Signed)
Summary: ccr-lr  Anticoagulant Therapy  Managed by: Vashti Hey, RN PCP: Dr. Kenyah Luba Jester Supervising MD: Diona Browner MD, Remi Deter Indication 1: Atrial Fibrillation (ICD-427.31) Lab Used: [facility] [place] Site: [facility] Clinic INR POC 2.4  Dietary changes: no    Health status changes: no    Bleeding/hemorrhagic complications: no    Recent/future hospitalizations: no    Any changes in medication regimen? no    Recent/future dental: no  Any missed doses?: yes     Details: missed 1 dose 2 weeks ago  Is patient compliant with meds? yes       Allergies: No Known Drug Allergies  Anticoagulation Management History:      The patient is taking warfarin and comes in today for a routine follow up visit.  Positive risk factors for bleeding include an age of 75 years or older.  The bleeding index is 'intermediate risk'.  Positive CHADS2 values include History of CHF and Age > 75 years old.  The start date was 12/31/2007.  Anticoagulation responsible provider: Diona Browner MD, Remi Deter.  INR POC: 2.4.  Cuvette Lot#: [id number].  Exp: 07/11.    Anticoagulation Management Assessment/Plan:      The patient's current anticoagulation dose is Warfarin sodium 5 mg tabs: Use as directed by Anticoagulation Clinic.  The target INR is 2 - 3.  The next INR is due 04/15/2010.  Anticoagulation instructions were given to daughter.  Results were reviewed/authorized by Vashti Hey, RN.  She was notified by Vashti Hey RN.         Prior Anticoagulation Instructions: INR 2.4 Continue coumadin 5mg  once daily except 7.5mg  on Tues/Fri  Current Anticoagulation Instructions: INR 2.4 Continue coumadin 5mg  once daily except 7.5mg  on Tuesdays and Fridays

## 2010-04-07 NOTE — Medication Information (Signed)
Summary: ccr-lr  Anticoagulant Therapy  Managed by: Vashti Hey, RN PCP: Dr. Samuel Jester Supervising MD: Andee Lineman MD, Michelle Piper Indication 1: Atrial Fibrillation (ICD-427.31) Lab Used: [facility] [place] Site: [facility] INR POC 2.4  Dietary changes: no    Health status changes: no    Bleeding/hemorrhagic complications: no    Recent/future hospitalizations: no    Any changes in medication regimen? no    Recent/future dental: no  Any missed doses?: no       Is patient compliant with meds? yes       Allergies: No Known Drug Allergies  Anticoagulation Management History:      The patient is taking warfarin and comes in today for a routine follow up visit.  Positive risk factors for bleeding include an age of 75 years or older.  The bleeding index is 'intermediate risk'.  Positive CHADS2 values include History of CHF and Age > 65 years old.  The start date was 12/31/2007.  Anticoagulation responsible Sinead Hockman: Andee Lineman MD, Michelle Piper.  INR POC: 2.4.  Cuvette Lot#: [id number].  Exp: 07/11.    Anticoagulation Management Assessment/Plan:      The patient's current anticoagulation dose is Warfarin sodium 5 mg tabs: Use as directed by Anticoagulation Clinic.  The target INR is 2 - 3.  The next INR is due 03/17/2010.  Anticoagulation instructions were given to daughter.  Results were reviewed/authorized by Vashti Hey, RN.  She was notified by Vashti Hey RN.         Prior Anticoagulation Instructions: INR 1.6 Take coumadin 2 1/2 tablets tonight then increase dose to 1 tablet once daily except 1 1/2 tablets on T/Fri  Current Anticoagulation Instructions: INR 2.4 Continue coumadin 5mg  once daily except 7.5mg  on Tues/Fri

## 2010-04-15 ENCOUNTER — Encounter: Payer: Self-pay | Admitting: Cardiology

## 2010-04-15 ENCOUNTER — Encounter (INDEPENDENT_AMBULATORY_CARE_PROVIDER_SITE_OTHER): Payer: Medicare Other

## 2010-04-15 DIAGNOSIS — I4891 Unspecified atrial fibrillation: Secondary | ICD-10-CM

## 2010-04-15 DIAGNOSIS — Z7901 Long term (current) use of anticoagulants: Secondary | ICD-10-CM

## 2010-04-21 NOTE — Medication Information (Signed)
Summary: ccr-lr  Lab Visit  Orders Today:  Anticoagulant Therapy  Managed by: Vashti Hey, RN PCP: Dr. Bandon Sherwin Jester Supervising MD: Diona Browner MD, Remi Deter Indication 1: Atrial Fibrillation (ICD-427.31) Lab Used: [facility] [place] Site: [facility] INR POC 2.5  Dietary changes: no    Health status changes: no    Bleeding/hemorrhagic complications: no    Recent/future hospitalizations: no    Any changes in medication regimen? no    Recent/future dental: no  Any missed doses?: no       Is patient compliant with meds? yes         Anticoagulation Management History:      The patient is taking warfarin and comes in today for a routine follow up visit.  Positive risk factors for bleeding include an age of 75 years or older.  The bleeding index is 'intermediate risk'.  Positive CHADS2 values include History of CHF and Age > 75 years old.  The start date was 12/31/2007.  Anticoagulation responsible provider: Diona Browner MD, Remi Deter.  INR POC: 2.5.  Cuvette Lot#: [id number].  Exp: 07/11.    Anticoagulation Management Assessment/Plan:      The patient's current anticoagulation dose is Warfarin sodium 5 mg tabs: Use as directed by Anticoagulation Clinic.  The target INR is 2 - 3.  The next INR is due 05/13/2010.  Anticoagulation instructions were given to daughter.  Results were reviewed/authorized by Vashti Hey, RN.  She was notified by Vashti Hey RN.         Prior Anticoagulation Instructions: INR 2.4 Continue coumadin 5mg  once daily except 7.5mg  on Tuesdays and Fridays  Current Anticoagulation Instructions: INR 2.5 Continue coumadin 5mg  once daily except 7.5mg  on Tues,Fri

## 2010-05-13 ENCOUNTER — Encounter: Payer: Self-pay | Admitting: Cardiology

## 2010-05-13 ENCOUNTER — Encounter (INDEPENDENT_AMBULATORY_CARE_PROVIDER_SITE_OTHER): Payer: Medicare Other

## 2010-05-13 DIAGNOSIS — Z7901 Long term (current) use of anticoagulants: Secondary | ICD-10-CM

## 2010-05-13 DIAGNOSIS — I4891 Unspecified atrial fibrillation: Secondary | ICD-10-CM

## 2010-05-13 LAB — CONVERTED CEMR LAB: POC INR: 2.2

## 2010-05-17 NOTE — Medication Information (Signed)
Summary: ccr-lr  Anticoagulant Therapy  Managed by: Vashti Hey, RN PCP: Dr. Samuel Jester Supervising MD: Andee Lineman MD, Michelle Piper Indication 1: Atrial Fibrillation (ICD-427.31) Lab Used: [facility] [place] Site: [facility] INR POC 2.2  Dietary changes: no    Health status changes: no    Bleeding/hemorrhagic complications: no    Recent/future hospitalizations: no    Any changes in medication regimen? no    Recent/future dental: no  Any missed doses?: no       Is patient compliant with meds? yes       Allergies: No Known Drug Allergies  Anticoagulation Management History:      The patient is taking warfarin and comes in today for a routine follow up visit.  Positive risk factors for bleeding include an age of 20 years or older.  The bleeding index is 'intermediate risk'.  Positive CHADS2 values include History of CHF and Age > 68 years old.  The start date was 12/31/2007.  Anticoagulation responsible provider: Andee Lineman MD, Michelle Piper.  INR POC: 2.2.  Cuvette Lot#: [id number].  Exp: 07/11.    Anticoagulation Management Assessment/Plan:      The patient's current anticoagulation dose is Warfarin sodium 5 mg tabs: Use as directed by Anticoagulation Clinic.  The target INR is 2 - 3.  The next INR is due 06/14/2010.  Anticoagulation instructions were given to daughter.  Results were reviewed/authorized by Vashti Hey, RN.  She was notified by Vashti Hey RN.         Prior Anticoagulation Instructions: INR 2.5 Continue coumadin 5mg  once daily except 7.5mg  on Tues,Fri  Current Anticoagulation Instructions: INR 2.2 Continue coumadin 5mg  once daily except 7.5mg  on Tuesdays and Fridays

## 2010-06-13 ENCOUNTER — Ambulatory Visit: Payer: Self-pay | Admitting: Cardiology

## 2010-06-13 DIAGNOSIS — Z7901 Long term (current) use of anticoagulants: Secondary | ICD-10-CM | POA: Insufficient documentation

## 2010-06-14 ENCOUNTER — Encounter: Payer: Medicare Other | Admitting: *Deleted

## 2010-06-28 ENCOUNTER — Ambulatory Visit (INDEPENDENT_AMBULATORY_CARE_PROVIDER_SITE_OTHER): Payer: Medicare Other | Admitting: *Deleted

## 2010-06-28 DIAGNOSIS — I4891 Unspecified atrial fibrillation: Secondary | ICD-10-CM

## 2010-06-28 DIAGNOSIS — Z7901 Long term (current) use of anticoagulants: Secondary | ICD-10-CM

## 2010-06-28 LAB — POCT INR: INR: 2.5

## 2010-07-12 ENCOUNTER — Ambulatory Visit (INDEPENDENT_AMBULATORY_CARE_PROVIDER_SITE_OTHER): Payer: Medicare Other | Admitting: *Deleted

## 2010-07-12 DIAGNOSIS — Z7901 Long term (current) use of anticoagulants: Secondary | ICD-10-CM

## 2010-07-12 DIAGNOSIS — I4891 Unspecified atrial fibrillation: Secondary | ICD-10-CM

## 2010-07-12 LAB — POCT INR: INR: 2.1

## 2010-07-19 ENCOUNTER — Encounter: Payer: Self-pay | Admitting: Cardiology

## 2010-07-19 ENCOUNTER — Encounter: Payer: Self-pay | Admitting: *Deleted

## 2010-07-19 NOTE — Assessment & Plan Note (Signed)
[facility]                          [facility]   Jasmine Watkins, Jasmine Watkins                       MRN:          [id number]  DATE:07/06/2008                            DOB:          15-Apr-1916    PRIMARY CARE PHYSICIAN:  Dr. Samuel Jester.   REASON FOR VISIT:  Routine followup.   HISTORY OF PRESENT ILLNESS:  Jasmine Watkins presents with her 2 daughters  for regular visit.  She is back living in her own home with intermittent  supervision from her daughters.  She has had no reported falls and seems  to be doing quite well without any angina.  She is NYHA class II-III  shortness of breath and has had no major palpitations, orthopnea, or  PND.  Her weight is down and she has had no complaints of lower  extremity edema.  She ultimately did not have eye surgery, which was  being discussed around the time of my last visit.  She has been using  warm compresses and other conservative measures, which seems to be  controlling things fairly well.  Medicines are outlined below.  She  continues on Coumadin under the direction of the Coumadin Clinic with a  goal INR of 2.0-3.0 range.  She has had no bleeding problems.   ALLERGIES:  No known drug allergies.   MEDICATIONS:  1. Ferrous sulfate 325 mg p.o. daily.  2. Synthroid 75 mcg p.o. daily.  3. Calcitonin spray daily.  4. Timolol eye drops daily.  5. Coumadin as directed by the Coumadin Clinic.  6. Os-Cal with vitamin D 600 mg p.o. b.i.d.  7. Carvedilol 25 mg p.o. b.i.d.  8. Digoxin 0.125 mg one-half tablet p.o. daily.  9. Lasix 80 mg p.o. b.i.d.  10.Diovan 80 mg p.o. daily.  11.Alprazolam 0.25 mg 1-2 tablets p.o. nightly.   REVIEW OF SYSTEMS:  As outlined above.  Otherwise, negative.   PHYSICAL EXAMINATION:  VITAL SIGNS:  Blood pressure is 131/76, heart  rate is 75 and irregular, weight is 118 pounds, and respirations 18.  GENERAL:  This is a thin elderly woman in no acute distress.  NECK:   No elevated jugular venous pressure.  LUNGS:  Clear without labored breathing.  CARDIAC:  Irregularly irregular rhythm.  Indistinct PMI.  ABDOMEN:  Soft.  Normoactive bowel sounds.  EXTREMITIES:  Trace below-the-knee edema that is symmetric.   IMPRESSION AND RECOMMENDATIONS:  1. Chronic systolic heart failure with a left ventricular ejection      fraction of 45-50% in the setting of presumed underlying ischemic      heart disease that is being managed conservatively at this point.      She is not reporting any angina and seems to be fairly euvolemic at      this time with no progressive shortness of breath over baseline.      Her medical regimen has fortunately been stable over the last few      months and she has had no obvious problems with fluid accumulation      or recurrent palpitations.  We will plan to continue  the present      therapy.  I will see her back over the next 3 months.  2. Permanent atrial fibrillation, well rate control at this point.      She continues on Coumadin for stroke prophylaxis.  She has not had      any bleeding problems or falls.     Jonelle Sidle, MD  Electronically Signed    SGM/MedQ  DD: 07/06/2008  DT: 07/07/2008  Job #: [id number]   cc:   Samuel Jester

## 2010-07-19 NOTE — Assessment & Plan Note (Signed)
[facility]                          [facility]   BO, ROGUE                       MRN:          [id number]  DATE:02/17/2008                            DOB:          1916-12-30    PRIMARY CARDIOLOGIST:  Jonelle Sidle, MD   REASON FOR VISIT:  Post-hospital followup.   Jasmine Watkins presents to our clinic following her most recent  hospitalization here at [facility] on February 06, 2008.  She was  discharged on February 10, 2008, following treatment of acute/chronic  systolic heart failure with persistent, large right pleural effusion.  Conservative management was recommended, versus thoracentesis,  particularly given that she would have had to have been taken off of  Coumadin in order to do the latter.  She responded well and was  discharged on increased doses of Lasix, Coreg, plus the addition of  digoxin.   Clinically, she has done extremely well, as corroborated by her  daughter, who states that her weights have remained stable.  The patient  herself, who is limited in her ability to provide a history, does not  suggest any symptoms of exertional dyspnea.   Of note, there was also an initial concern by the daughter that the  patient might have had a TIA.  Her symptoms completely resolved and a CT  scan of the head was negative.  The patient was also treated for a UTI.  A followup chest x-ray suggested less fluid on the right side, as  compared a previous study.   CURRENT MEDICATIONS:  1. Lasix 80 mg b.i.d.  2. Digoxin 0.0625 daily.  3. Coreg 25 b.i.d.  4. Diovan 80 daily.  5. Cardizem 120 daily.  6. Coumadin 5 mg, as directed.  7. Synthroid 0.075 daily.  8. Ferrous sulfate 325 daily.  9. Timolol eye drops.   PHYSICAL EXAMINATION:  GENERAL:  Blood pressure 137/82, pulse 57 and  irregular, weight 122 (down 3).  GENERAL:  An 77 year old female, sitting upright, in no distress.  HEENT:  Normocephalic,  atraumatic.  NECK:  Palpable carotid pulses without JVD.  LUNGS:  Diminished breath sounds on the right, but without crackles or  wheezes.  HEART:  Irregularly regular.  No significant murmurs.  ABDOMEN:  Benign.  EXTREMITIES:  A 2+ bilateral nonpitting edema.  NEURO:  Alert and oriented.   IMPRESSION:  1. Chronic diastolic heart failure.      a.     EF 45-50%.  2. Permanent atrial fibrillation.      a.     Rate controlled.  3. Chronic Coumadin.  4. Persistent right pleural effusion, improved.  5. Valvular heart disease.      a.     Mild/moderate mitral regurgitation, tricuspid regurgitation;       mild aortic stenosis.  6. Chronic renal insufficiency.  7. Hypertension.   PLAN:  1. Continue current medication regimen.  2. Follow up BMET and BNP level.  3. Schedule return clinic followup with myself and Dr. Diona Browner in 3      months.      Gene Serpe, PA-C  Electronically Signed      Jonelle Sidle, MD  Electronically Signed   GS/MedQ  DD: 02/17/2008  DT: 02/18/2008  Job #: [id number]   cc:   Samuel Jester

## 2010-07-19 NOTE — Assessment & Plan Note (Signed)
[facility]                          [facility]   Jasmine Watkins, Jasmine Watkins                       MRN:          [id number]  DATE:03/10/2008                            DOB:          1916-07-24    PRIMARY CARE PHYSICIAN:  Dr. Samuel Jester.   OPHTHALMOLOGIST:  Amador Cunas, MD, [facility]   Jasmine Watkins is an 77 year old woman with a history of persistent atrial  fibrillation complicated by intermittent rapid ventricular response,  acute on chronic systolic heart failure presentations on a baseline left  ventricular ejection fraction of 45%, probable underlying cardiovascular  disease based on echocardiographically documented wall motion  abnormalities (not pursued aggressively as far as invasive cardiac  testing), and dementia.  She is being considered for an elective  ophthalmologic surgery under general anesthesia at [facility] by Dr. Carilyn Goodpasture.  We have been asked to comment on [name] perioperative cardiac risk.  I last saw her in the office back  in late November, at which time she was reasonably stable, although we  were continuing to adjust her medications to address both atrial  fibrillation rate control and her cardiomyopathy.  In December 2009 she  presented to [facility] with acute on chronic systolic  heart failure as well as rapid ventricular rates in atrial fibrillation.  Medical therapy was adjusted, and she is on the regimen outlined below.   Medications at the present time include:  1. Coumadin as directed by the Coumadin Clinic to achieve a      therapeutic INR of 2.0-3.0.  2. Digoxin 0.125 mg one-half tablet daily.  3. Diovan 80 mg p.o. daily.  4. Ferrous sulfate 324 mg p.o. daily.  5. Lasix 80 mg p.o. b.i.d.  6. Carvedilol 25 mg p.o. b.i.d.  7. Synthroid 75 mcg p.o. daily.  8. Calcium with vitamin D 600 mg p.o. b.i.d.  9. Calcitonin daily.  10.Timolol eye  drops 0.5% daily.  11.Amoxicillin/clavulanic 875 mg/125 mg p.o. b.i.d. for 10-day course.   Jasmine Watkins is a fairly frail elderly woman with persistent atrial  fibrillation associated with a CHADS-2 score of 3 on Coumadin, as well  as recurrent presentations with acute on chronic combined heart failure  with baseline left ventricular ejection fraction of 45%.  Medical  therapy has been adjusted, although Jasmine Watkins certainly remains at  risk for decompensated heart failure and uncontrolled arrhythmia.  I  would expect that her perioperative cardiac complication risk would be  in the moderate to high range with general anesthesia.  If this course  is undertaken, I would suggest that she be admitted to the hospital and  seen in consultation by the Cardiology Service at [facility] to follow her along closely given her increased risk for  decompensated heart failure, ischemic, and arrhythmic events.  If there  are  other alternatives to eye surgery that are less invasive, I would  certainly pursue these options first.     Jonelle Sidle, MD  Electronically Signed    SGM/MedQ  DD: 03/10/2008  DT: 03/10/2008  Job #: [id number]   cc:   Amador Cunas, MD  Samuel Jester

## 2010-07-19 NOTE — Assessment & Plan Note (Signed)
[facility]                          [facility]   Jasmine Watkins, Jasmine Watkins                       MRN:          [id number]  DATE:12/30/2007                            DOB:          10-22-1916    PRIMARY CARE PHYSICIAN:  Dr. Samuel Jester.   REASON FOR VISIT:  Atrial fibrillation.   HISTORY OF PRESENT ILLNESS:  Jasmine Watkins is a pleasant 77 year old woman  with a history of persistent atrial fibrillation diagnosed back in the  summer in the setting of decompensated diastolic heart failure,  hypertension, hyponatremia and pleural effusions.  She has actually been  seen by a number of different physicians during recurrent hospital stays  at [facility] since that time including Dr. Doyne Keel, Dr. Alois Cliche,  Dr. Linna Darner, and Dr. Olena Leatherwood.  She actually has pending follow up with Dr.  Linna Darner based on some of these admissions and is on Coumadin, which was  initiated at some point over the last few months.  She has undergone  rehabilitation and is now living at home with her daughter.  She has 2  daughters and both present today.  Jasmine Watkins is hard of hearing and  provides somewhat of limited history, although seemingly from a clinical  perspective, has been fairly stable over the last month.  In reviewing  available data, she had an echocardiogram done in July demonstrating an  ejection fraction of 45-50% with inferior septal and inferior lateral  hypokinesis suggestive of underlying cardiovascular disease.  I note  that cardiac markers have been normal recently, however.  She does have  valvular heart disease including very mild aortic stenosis, mild-to-  moderate mitral regurgitation, mild-to-moderate tricuspid regurgitation,  and evidence of mild pulmonary hypertension.  Chest x-ray done back in  late September demonstrated moderate-sized bilateral pleural effusions  with some atelectasis and interstitial edema.  She has been on diuretic  therapy and in fact most of the regimen outlined below is fairly new  over the last few months.  Both daughter states that Jasmine Watkins has not  had any recent falls.  She has had no major bleeding problems as she had  on Coumadin.  Today, we talked about the rationale for anticoagulant  therapy for stroke prophylaxis in atrial fibrillation.  Jasmine Watkins  CHADS2 score would be 3 at this point.  I has also talked about the  rationale for heart rate control.  In reviewing the data, she has had  presentations with rapid rate precipitating her heart failure symptoms.  Jasmine Watkins denies any clear recent onset of chest pain.  She does have  lower extremity edema that is dependent.  She has otherwise no major  sense of palpitations.   LABORATORY DATA:  Recent lab work from December 17, 2007, demonstrated a  BUN and creatinine of 23 and 1.3, sodium of 131, potassium of 4.3.  BNP  as of December 04, 2007, was 472.   ALLERGIES:  No known drug allergies.   PRESENT MEDICATIONS:  1. Diovan 80 mg p.o. daily.  2. Coreg 6.25 mg p.o. b.i.d.  3. Ferrous sulfate 325  mg p.o. daily.  4. Synthroid 75 mcg p.o. daily.  5. Calcitonin.  6. Nasal spray.  7. Timolol eye drops.  8. Coumadin 5 mg daily.  9. Lasix 40 mg p.o. b.i.d.  10.Aldactone 25 mg p.o. daily.  11.Cardizem CD 120 mg p.o. daily.  12.Os-Cal with vitamin D b.i.d.   REVIEW OF SYSTEMS:  As outlined above.  Otherwise negative.   PAST MEDICAL HISTORY:  Also includes prior urinary tract infections,  hypothyroidism, and gout.  She has had previous back surgery, cataract  surgery and remote thyroid surgery.   SOCIAL HISTORY:  There is no active tobacco or alcohol use.  She lives  at home with her daughter, present today.   FAMILY HISTORY:  Reviewed and is noncontributory.   PHYSICAL EXAMINATION:  VITAL SIGNS:  Blood pressure is 141/92, heart  rate at 110 beats per minute at rest, and weight is 132 pounds.  GENERAL:  This is a normally  nourished appearing elderly woman sitting,  in no acute distress.  HEENT:  The patient is wearing glasses.  Lids are normal.  Oropharynx  clear.  NECK:  Supple.  No elevated jugular venous pressure.  No loud bruits.  LUNGS:  Essentially clear with decreased breath sounds at the very  bases.  No egophony.  CARDIAC:  An irregularly irregular rhythm.  No loud systolic murmur.  No  pericardial rub.  ABDOMEN:  Soft, nontender.  EXTREMITIES:  Venous stasis and 1+ symmetrical pitting edema.  MUSCULOSKELETAL:  No kyphosis noted.  NEUROPSYCHIATRIC:  The patient is alert and oriented x3.  Affect seems  appropriate.   IMPRESSION AND RECOMMENDATIONS:  1. Persistent atrial fibrillation, diagnosed back in July and      associated with rapid ventricular response, precipitating      decompensated heart failure.  There looks to be an element of      likely diastolic and systolic dysfunction with a left ventricular      ejection fraction at approximately 45-50%.  I suspect Jasmine Watkins      does have underlying coronary artery disease based on wall motion      abnormalities, although is not having any active angina at this      time.  We discussed the matter and at this point, plan will be for      medical therapy.  For the time being, we will continue Coumadin and      I will plan to enroll her in our Coumadin Clinic for appropriate      followup.  Goal INR should be 2.0-3.0.  I think, she also needs      more aggressive heart rate control.  To this end, we will then up      titrate Coreg to 12.5 mg p.o. b.i.d.  She may well need further      titration of either Coreg or Cardizem to achieve optimal heart rate      control.  No other change in Lasix dose at this time.  A followup      BMET and BNP will be obtained in 2 weeks and I will plan to see her      back over the next 3 weeks.  She will      otherwise continue regular followup with Dr. Charm Barges.  2. Further plans to follow up.     Jonelle Sidle, MD  Electronically Signed    SGM/MedQ  DD: 12/30/2007  DT: 12/31/2007  Job #: [id number]   cc:  Samuel Jester

## 2010-07-19 NOTE — Assessment & Plan Note (Signed)
[facility]                          [facility] NOTE   KINDAL, PONTI                       MRN:          [id number]  DATE:01/27/2008                            DOB:          [birth year]    PRIMARY CARE PHYSICIAN:  Dr. Samuel Jester.   REASON FOR VISIT:  Followup atrial fibrillation.   HISTORY OF PRESENT ILLNESS:  I saw Ms. Jasmine Watkins back in late October  2009.  She reports stable dyspnea on exertion with fairly limited  functional capacity, but no chest pain, orthopnea, or PND.  She has  stable lower extremity edema, right greater than left.  She generally  reports less problems with symptomatic palpitations.  She tolerated the  adjustments that we made in her medications, and her daughters both  present today, report that she has not had any major bleeding problems  on Coumadin.  She had followup blood work showing a BUN and creatinine  of 34 and 1.3.  Sodium of 138 and potassium of 4.8.  BNP level was  increased to 809 from 525, although weight is down.   ALLERGIES:  No known drug allergies.   MEDICATIONS:  1. Diovan 80 mg p.o. daily.  2. Ferrous sulfate 325 mg p.o. daily.  3. Synthroid 75 mcg p.o. daily.  4. Calcitonin spray daily.  5. Timolol eye drops daily.  6. Coumadin as directed by the Coumadin Clinic.  7. Lasix 40 mg p.o. b.i.d.  8. Cardizem CD 120 mg p.o. daily.  9. Os-Cal with vitamin D twice daily.  10.Carvedilol 12.5 mg p.o. b.i.d.   REVIEW OF SYSTEMS:  As outlined above.  Otherwise, negative.   PHYSICAL EXAMINATION:  VITAL SIGNS:  Blood pressure is 130/75, heart  rate is 69 and irregular, and weight is 125 pounds, which is down from a  132 pounds at her last visit.  GENERAL:  The patient is comfortable and in no acute distress.  NECK:  No elevated jugular venous pressure.  No loud bruits.  LUNGS:  Exhibit diminished, but clear breath sounds.  No egophony or  labored breathing.  CARDIAC:  Regularly irregular  rhythm.  No S3 gallop.  EXTREMITIES:  Exhibit venous stasis and 1-2+ edema.   IMPRESSION AND RECOMMENDATIONS:  Persistent atrial fibrillation, better  controlled in terms of heart rate response, and tolerating Coumadin at  this point.  She has concurrent diastolic and mild systolic left-sided  heart failure with an ejection fraction of 45-50%.  I would like for her  to increase carvedilol to 12.5 mg 1-1/2 tablets p.o. b.i.d. and  concurrently stop Cardizem CD.  This may also help with some of her  lower extremity edema.  I see that her BNP level is increased somewhat,  although her weight is generally down, and I would like to avoid  advancing her Lasix at this point if possible.  My hope is that we can  attain both heart rate control and more optimal management of her left  ventricular dysfunction by advancing carvedilol in the absence of  Cardizem CD.  The patient's daughters states that they have an  automatic  blood pressure cuff/heart rate monitor at home and they will begin to  check more regularly.  I will plan to see her back over the next few  weeks for further medication adjustments as needed.     Jonelle Sidle, MD  Electronically Signed    SGM/MedQ  DD: 01/27/2008  DT: 01/28/2008  Job #: [id number]   cc:   Samuel Jester

## 2010-07-21 ENCOUNTER — Ambulatory Visit (INDEPENDENT_AMBULATORY_CARE_PROVIDER_SITE_OTHER): Payer: Medicare Other | Admitting: Cardiology

## 2010-07-21 ENCOUNTER — Ambulatory Visit: Payer: Medicare Other | Admitting: Cardiology

## 2010-07-21 ENCOUNTER — Encounter: Payer: Self-pay | Admitting: Cardiology

## 2010-07-21 VITALS — BP 151/61 | HR 76 | Ht 65.0 in | Wt 116.0 lb

## 2010-07-21 DIAGNOSIS — Z79899 Other long term (current) drug therapy: Secondary | ICD-10-CM

## 2010-07-21 DIAGNOSIS — I4891 Unspecified atrial fibrillation: Secondary | ICD-10-CM

## 2010-07-21 DIAGNOSIS — I5022 Chronic systolic (congestive) heart failure: Secondary | ICD-10-CM

## 2010-07-21 NOTE — Assessment & Plan Note (Signed)
In sinus rhythm with PACs today. No changes to present medical regimen.

## 2010-07-21 NOTE — Progress Notes (Signed)
Clinical Summary Jasmine Watkins is a 77 y.o.female presenting for followup. Since last visit she was admitted to [facility] with weakness, diagnosed with delerium, acute on chronic renal failure and supratherapeutic INR on Bactrim.  Lab work from April 4 showed sodium 136, potassium 4.2, BUN 45, creatinine 1.8.  She had a post hospital nursing home stay, now back home with family support. She has had increased lower extremity edema, although reportedly not receiving her Lasix on a daily basis per discussion with the daughters. We discussed appetite, adequate diet, fluid restriction. Otherwise continued observation on medical therapy.  No Known Allergies  Current outpatient prescriptions:Calcium Carbonate-Vitamin D (CALTRATE 600+D) 600-400 MG-UNIT per tablet, Take 1 tablet by mouth 2 (two) times daily.  , Disp: , Rfl: ;  carvedilol (COREG) 25 MG tablet, Take 25 mg by mouth 2 (two) times daily with a meal.  , Disp: , Rfl: ;  SYNTHROID 75 MCG tablet, Take 75 tablets by mouth Daily., Disp: , Rfl: ;  timolol (TIMOPTIC-XR) 0.5 % ophthalmic gel-forming, Place 1 drop into both eyes Daily., Disp: , Rfl:  warfarin (COUMADIN) 5 MG tablet, Take by mouth as directed.  , Disp: , Rfl: ;  DISCONTD: calcitonin, salmon, (MIACALCIN/FORTICAL) 200 UNIT/ACT nasal spray, 1 spray by Nasal route daily.  , Disp: , Rfl: ;  DISCONTD: digoxin (LANOXIN) 0.125 MG tablet, Take 62.5 mcg by mouth daily.  , Disp: , Rfl: ;  DISCONTD: ferrous sulfate 325 (65 FE) MG tablet, Take 325 mg by mouth daily with breakfast.  , Disp: , Rfl:  DISCONTD: furosemide (LASIX) 80 MG tablet, Take 80 tablets by mouth Twice daily., Disp: , Rfl: ;  DISCONTD: valsartan (DIOVAN) 80 MG tablet, Take 80 mg by mouth daily.  , Disp: , Rfl:   Past Medical History  Diagnosis Date  . Atrial fibrillation   . Chronic systolic heart failure     LVEF 45-50%  . Essential hypertension, benign   . Hypothyroidism   . Gout   . Osteoporosis     Social History Jasmine Watkins  reports that she has never smoked. She has never used smokeless tobacco. Jasmine Watkins reports that she does not drink alcohol.  Review of Systems Complains of weakness, lower extremity edema. Stable appetite. No chest pain or palpitations. Otherwise reviewed and negative.  Physical Examination Filed Vitals:   07/21/10 1424  BP: 151/61  Pulse: 76  Thin elderly woman in no acute distress. Hard of hearing.  HEENT: Conjunctiva and lids normal, oropharynx with moist mucosa.  Neck: Supple, no elevated venous pressure.  Lungs: Clear to auscultation, nonlabored breathing.  Cardiac: Regular with ectopic beats. Indistinct PMI. No S3 gallop.  Extremities: Trace ankle edema, nonpitting. Distal pulses one plus.  Skin: Warm and dry. Musculoskeletal: Kyphosis. Neuropsychiatric: Hard of hearing, alert and oriented x2.   ECG Sinus rhythm with PACs, poor anterior R-wave progression, leftward axis, low voltage in the limb leads.   Problem List and Plan

## 2010-07-21 NOTE — Assessment & Plan Note (Signed)
Continue present medical regimen. Fluid and sodium restriction discussed. Use Lasix on a daily basis.

## 2010-07-21 NOTE — Assessment & Plan Note (Signed)
Creatinine 1.8 in April, on low-dose Lasix at this point. Plan to continue daily Lasix 20 mg, followup BMET in 2 weeks.

## 2010-07-21 NOTE — Patient Instructions (Signed)
Follow up as scheduled. Your physician recommends that you go to the [facility] for lab work: [employer]. DO IN 2 WEEKS, AROUND MAY 31ST. Make sure you are taking Lasix (furosemide) once per day.

## 2010-07-22 NOTE — Discharge Summary (Signed)
NAMEJUNELLE, Jasmine Watkins                ACCOUNT NO.:  [id number]   MEDICAL RECORD NO.:  [id number]          PATIENT TYPE:  INP   LOCATION:  1316                         FACILITY:  [facility]   PHYSICIAN:  Marcellus Scott, MD     DATE OF BIRTH:  04-Feb-1917   DATE OF ADMISSION:  06/06/2006  DATE OF DISCHARGE:                               DISCHARGE SUMMARY   INTERIM DISCHARGE SUMMARY   DATE OF DISCHARGE:  To be determined.   PRIMARY CARE PHYSICIAN:  Dr. Samuel Jester, [place], [place].   DISCHARGE DIAGNOSES:  1. Intractable nausea, intermittent vomiting, abdominal pain.  2. Hyponatremia.  3. Hypertension.  4. Acute T12 fracture.  5. Anemia.  6. Questionable pneumonia.  7. Hypokalemia.  8. Chronic constipation.  9. Hypothyroidism.  10.Leukocytosis.   DISCHARGE MEDICATIONS:  (To be finalized on actual discharge but may  include):  1. Protonix 40 mg p.o. b.i.d.  2. Senokot S one tablet p.o. q.h.s.  3. Synthroid 75 mcg p.o. daily.  4. Os-Cal 500 mg plus D one tablet p.o. b.i.d.  5. Timoptic 0.5% one drop in each eye daily.  6. Avapro 300 mg p.o. daily.  7. Tylenol 650 mg p.o. q.4-6h. p.r.n.  8. Fosamax 70 mg p.o. q. weekly on Sunday's.  9. Aspirin 81 mg p.o. daily.  10.Reglan 5 mg p.o. q.a.c. and q.h.s.  11.Ultram 50 mg p.o. q.6h. p.r.n.  12.Vitamin E 400 international units p.o. daily.  13.Multivitamin one p.o. daily.   PROCEDURES:  1. MRI thoracic spine with impression of:      a.     Findings consistent with acute, benign osteopenic       compression fracture of T12 with loss of approximately 50%       vertebral body height and retropulsion of bone into the canal.      b.     There is no other evidence of foci of abnormal marrow signal       nor is there evidence of cord compression or abnormal cord signal.      c.     No thoracic  disk protrusions are identified.      d.     Bilateral pleural effusions with cardiomegaly.  2. On June 06, 2006, chest x-ray with  impression:      a.     Bilateral pleural effusions and probable edema. Interstitial       air space disease in right upper lung concerning for an area of       infection and based on prior CT scan findings, multifocal       pneumonia cannot be excluded.      b.     Compression fracture involving T12.  3. CT scan of abdomen with contrast on June 06, 2006 with impression:      a.     T12 vertebral compression deformity, age indeterminate.      b.     Bilateral pleural effusions, bibasilar atelectasis and mild       air space disease at the left lung base.  Cannot rule out left  lower lobe pneumonia.      c.     Mildly increased caliber of proximal pancreatic duct.  4. CT scan of pelvis with contrast:      a.     No acute pelvic CT scan findings.  5. June 06, 2006, x-ray of abdomen. Impression:      a.     Small bilateral pleural effusions with compressive       atelectasis.      b.     Nonspecific bowel gas pattern.   PERTINENT LABORATORY DATA:  Results to be dictated at the time of actual  discharge.   CONSULTATIONS:  1. Gastroenterology, Dr. Loreta Ave.  2. Interventional radiology, Dr. Deanne Coffer.   HOSPITAL COURSE:  For details of the initial part of the admission,  please refer to the history and physical note that was done by Dr.  Waymon Amato on June 06, 2006.  In summary,  Ms. Archambeau is a pleasant 77-  year-old female patient with history of hypertension, hypothyroidism,  partial deafness, who was recently admitted at [facility] in [place] for an Escherichia coli urinary tract  infection, dehydration, hyponatremia, hypokalemia. She presented with  intractable nausea, intermittent vomiting and abdominal discomfort/pain.  On further evaluation, she was noted to have chest x-ray suggestive of  pneumonia and hyponatremia.  She was admitted for further evaluation and  management.   PROBLEM #1:  INTRACTABLE NAUSEA, INTERMITTENT VOMITING, UPPER ABDOMINAL   PAIN:  The etiology of this is still unclear.  The differential  diagnoses include gastritis/peptic ulcer disease, gallstone disease.  The patient was admitted to the hospital and was placed on clear liquids  orally, IV hydration, IV PPI's and antiemetics.  With these the patient  initially improved following which her diet was graduated.  However, she  again had recurrence of symptoms following which a GI consult was  obtained.  Please refer to their notes for further details.  There were  no procedures done yet.  The patient's symptoms have significantly  improved, however, she still is with intermittent nausea but no vomiting  and questionable abdominal discomfort. However, the history is unclear.   PROBLEM #2: HYPONATREMIA:  This was secondary to her dehydration and  questionable syndrome of inappropriate antidiuretic hormone secretion.  The patient was initially hydrated and her laboratory values have  normalized.   PROBLEM #3: HYPERTENSION:  Which is mildly uncontrolled.  The patient is  on Avapro.  Might need to consider adding a second agent.   PROBLEM #4:  ACUTE T12 FRACTURE:  Subsequently during the admission the  patient complained of mid back pain and the daughter also confirmed that  a couple of days back the patient was complaining of excruciating back  pain.  Following this an MRI was added to her previous CT scan which  revealed an acute T12 compression fracture.  For this, interventional  radiology has been consulted.  They are planning to do a T12  vertebroplasty once her aspirin has been held for three days and her  Lovenox has been held.   PROBLEM #5:  ANEMIA:  This is stable.   PROBLEM #6:  QUESTIONABLE PNEUMONIA:  The patient is to complete her  course of oral Avelox.  She is asymptomatic of chest symptoms.  Will  followup a repeat chest x-ray.  PROBLEM #7:  HYPOKALEMIA:  This has been repleted and corrected.   PROBLEM #8:  CHRONIC CONSTIPATION:  The patient  has had a bowel movement  following laxatives in the hospital.   PROBLEM #9:  HYPOTHYROIDISM:  The patient's TSH done at previous  admission at [facility] was normal and she is to continue her Synthroid.   PROBLEM #10:  LEUKOCYTOSIS:  Questionable for stress.  There is no focus  of sepsis at this time.  To follow up her CBC's.      Marcellus Scott, MD  Electronically Signed     AH/MEDQ  D:  06/11/2006  T:  06/11/2006  Job:  [id number]   cc:   Samuel Jester  Fax: [phone]   D. Oley Balm III, M.D.  Fax: [phone]   GNFAOZ HYQ MVHQ, M.D.  Fax: [phone]

## 2010-07-22 NOTE — Discharge Summary (Signed)
Jasmine Watkins, Jasmine Watkins                ACCOUNT NO.:  [id number]   MEDICAL RECORD NO.:  [id number]          PATIENT TYPE:  INP   LOCATION:  1316                         [facility]   PHYSICIAN:  Herbie Saxon, MDDATE OF BIRTH:  05/21/1916   DATE OF ADMISSION:  06/06/2006  DATE OF DISCHARGE:  06/15/2006                               DISCHARGE SUMMARY   ADDENDUM   DISCHARGE DIAGNOSES:  1. T12 __________  status post kyphoplasty June 13, 2006.  2. Anemia of chronic disease.  3. Bilateral lower lobe pneumonia, pleural effusion.  4. Hypokalemia, resolved.  5. Leukocytosis, resolved.  6. Hypothyroidism.  7. __________  constipation.   DISCHARGE MEDICATIONS:  As per the discharge summary dictated by Dr.  Marcellus Scott, June 06, 2006.   ADDITIONAL MEDICATION:  1. Avelox 400 mg daily.  2. Megace 440 mg daily.   PROCEDURES:  The MRA showed no thoracic disk protrusion.   HOSPITAL COURSE:  As previously dictated by Dr. Waymon Amato on June 12, 2006.   DISCHARGE LABS:  WBCs 10, hematocrit 30, platelet count is 32, glucose  110, sodium 113, potassium 3.6, chloride 102, bicarbonate 13, BUN 3,  creatinine 0.8.   DISPOSITION:  Home with home health.  To follow up with primary care  physician, Dr. Charm Barges, in 3 to 5 days.   CONSULTATIONS:  Dr. Loreta Ave, gastroenterology; Dr. Deanne Coffer, interventional  radiology for the kyphoplasty.      Herbie Saxon, MD  Electronically Signed     MIO/MEDQ  D:  07/12/2006  T:  07/12/2006  Job:  [id number]

## 2010-07-22 NOTE — H&P (Signed)
NAMESHAMIRACLE, GORDEN                ACCOUNT NO.:  [id number]   MEDICAL RECORD NO.:  [id number]          PATIENT TYPE:  EMS   LOCATION:  ED                           FACILITY:  [facility]   PHYSICIAN:  Marcellus Scott, MD     DATE OF BIRTH:  03-Apr-1916   DATE OF ADMISSION:  06/06/2006  DATE OF DISCHARGE:                              HISTORY & PHYSICAL   PRIMARY MEDICAL DOCTOR:  Dr. Samuel Jester in [place].   HISTORIANS:  Dow Adolph and Etheleen Mayhew, patient's daughters, who are  at the bedside.   CHIEF COMPLAINT:  Poor appetite, intractable nausea, intermittent  vomiting, constant abdominal pain, generalized weakness.   HISTORY OF PRESENT ILLNESS:  Jasmine Watkins is a pleasant 77 year old  Caucasian female patient with past medical history as indicated below.  She was in her usual state of health until approximately two weeks ago  when she started having lethargy, poor appetite, runny nose, dry cough,  generalized body aches, vomiting.  For these complaints she was admitted  at the [facility] in [place], [place], and treated  for [facility] urinary tract infection, dehydration, hyponatremia,  hypokalemia.  She had a sodium of 121 na potassium of 3 during that  admission.  She was hydrated intravenously, treated with ciprofloxacin  for her E. coli urinary tract infection, and subsequently discharged to  complete her oral course of antibiotics and the other medications.  Since her discharge, however, the patient has continued to remain weak  with decreased p.o. intake.  The patient only consumes a small amount of  solid food and mostly liquids.  She continues to have intractable nausea  and intermittent vomiting, mainly today when she vomitted up about 1-2  times, which consisted of liquids that she has consumed with no coffee  grounds or blood.  She also complains of some upper abdominal discomfort  or pain, it is hard to discern.  The patient denies any  history of  constipation or diarrhea.  She has had BM's daily until two days ago.  Patient denies any history of fever, chills, or rigors.  Patient  continued to become progressively weak, unsteady while walking with the  help of the walker, and she was scared that she was going to fall;  however, she has not sustained any falls.  For this worsening of  symptoms, the patient was brought to the emergency room here for further  evaluation and management.   PAST MEDICAL HISTORY:  1. Hypertension.  2. Hypothyroidism.  3. Partial deafness.   PAST SURGICAL HISTORY:  1. Status post thyroidectomy.  2. Bilateral cataract surgery.   ALLERGIES:  No known drug allergies.   MEDICATIONS:  1. Klor-Con 10 mEq tablets 1 tablet daily.  2. Ciprofloxacin 500 mg p.o. b.i.d.  3. Synthroid 75 mcg p.o. daily.  4. Enteric-coated aspirin 81 mg p.o. daily.  5. Tramadol 50 mg, 2 tablets every 6-8 hours p.r.n. pain.  6. Metoclopramide 5 mg tablet 1 before meals and at bedtime.  7. Micardis/HCT, 80 mg/25 mg 1 tablet p.o. daily.  8. Calcium 1200 plus  vitamin D, 1 tablet p.o. daily.  9. Vitamin E 400 IU p.o. daily.  10.Fosamax weekly.   FAMILY HISTORY:  Noncontributory.   SOCIAL HISTORY:  Up to two weeks ago, patient lived independently by  herself, managing her own activity and ambulating. Since her illness,  patient has been temporarily living with her daughter and is able to  walk with the help of a walker.  There is no history of alcohol, tobacco  abuse, or substance abuse.   ADVANCED DIRECTIVES:  Full code.   REVIEW OF SYSTEMS:  Over 10 systems reviewed.  The only additional  features is intermittent dull headache, dry cough but no chest pain,  dyspnea.   PHYSICAL EXAMINATION:  GENERAL:  Jasmine Watkins is a moderately built and  nourished female patient in no obvious cardiopulmonary or painful  distress.  VITAL SIGNS:  Temperature 97.4 degrees Fahrenheit, blood pressure  170/95, pulse 71 per  minute, respirations 18 per minute, saturating at  95% on room air.  HEENT:  Head is normocephalic and atraumatic.  Pupils are equal, round  and reactive to light and accommodation.  Oral mucosa slightly dry but  no pharyngeal erythema or drainage.  NECK:  Without any JVD, carotid bruits, lymphadenopathy, or goiter.  Neck is supple.  RESPIRATORY:  Clear to auscultation bilaterally except for slightly  decreased breath sounds in the bases.  CARDIOVASCULAR:  First and second heart sounds are heard.  No third or  fourth heart sounds.  No murmurs, rubs, gallops, or clicks.  ABDOMEN:  Obese but nontender.  No organomegaly or mass.  Bowel sounds  are appreciated normally.  CENTRAL NERVOUS SYSTEM:  Patient is awake, alert, and oriented x3 with  no focal neurological deficits.  EXTREMITIES:  Tiny tortuous venules in bilateral lower extremities.  There is no clubbing, cyanosis or edema.  Bilateral symmetrical  peripheral pulses felt.  SKIN:  With 1 or 2 spots of bruising, especially in the left forearm.   LABS:  CBC is remarkable for a hemoglobin of 11.4, hematocrit 34, white  blood cells 17.2, platelets 409.  Basic metabolic panel with sodium of  122, potassium 4.1, chloride 84, bicarb 29, glucose 154, BUN 14,  creatinine 0.96.  Calcium 8.3.  Hepatic panel with alkaline phosphatase  of 126, AST 18, ALT 10, total protein 5.9, albumin 3.  Lipase and  amylase are normal.  Urinalysis is only positive for protein of 100.   RADIOLOGY:  CT of the abdomen with contrast.  Impression is:  1) T12  vertebral compression deformity, age indeterminate.  1. Bilateral pleural effusions, bibasilar atelectasis, and mild air      space disease at the left lung base, cannot rule out left lower      lobe pneumonia.  3)  Mildly increased caliber of proximal      pancreatic duct.   Pelvic CT with contrast.  Impression is no acute pelvic CT findings.   EKG is normal sinus rhythm.  Ultrasound of the abdomen has  been done at [facility].  The impression of which is chronic changes but stable since 2005.  No  acute process.   Chest x-ray done at [facility].  Impression is enlargement of the  cardiac silhouette, which appears slightly larger than before.  Pleural  thickening with calcification, unchanged.  No acute abdominal process  seen.   ASSESSMENT/PLAN:  1. Intractable nausea, intermittent vomiting, upper abdominal pain.      Etiology questionable for gastritis/peptic ulcer disease.  Will  admit patient to the hospital.  Will place the patient on clear      liquids orally as tolerated, IV fluids hydration, high-dose IV      proton pump inhibitors, antiemetics.  Will also check a set of      cardiac enzymes to rule out for an atypical presentation of acute      coronary syndrome.  Will also check serum lactate levels. If      patient does not settle, consider GI consult.  2. Dehydration secondary to problem #1, decreased p.o. intake,      thiazide diuretics.  Will hydrate the patient intravenously and      monitor output and input closely. Hold diuretics.  3. Hyponatremia secondary to the problem, #2.  Will hydrate the      patient intravenously with normal saline.  Will hold the thiazide      diuretics, which she probably should not receive any further.  Will      follow her basic metabolic panel.  4. Pneumonia of the left base; however, not convincing clinically.      Will however place the patient on IV Levaquin and obtain a sputum      culture if provided.  5. Leukocytosis secondary to stress or pneumonia:  Will follow up CBCs      with management above.  6. Hypertension which is uncontrolled.  Will continue Micardis at home      dose.  7. Hypothyroidism:  Her TSH was normal at recent admission, so we will      continue her on her Synthroid.  8. Anemia:  Follow up CBCs.  9. T12 fracture, ?age. Without symptoms or signs. To follow.      Marcellus Scott,  MD  Electronically Signed     AH/MEDQ  D:  06/06/2006  T:  06/06/2006  Job:  [id number]   cc:   Samuel Jester  Fax: [phone]

## 2010-08-09 ENCOUNTER — Ambulatory Visit (INDEPENDENT_AMBULATORY_CARE_PROVIDER_SITE_OTHER): Payer: Medicare Other | Admitting: *Deleted

## 2010-08-09 DIAGNOSIS — Z7901 Long term (current) use of anticoagulants: Secondary | ICD-10-CM

## 2010-08-09 DIAGNOSIS — I4891 Unspecified atrial fibrillation: Secondary | ICD-10-CM

## 2010-08-09 LAB — POCT INR: INR: 1.9

## 2010-09-06 ENCOUNTER — Ambulatory Visit (INDEPENDENT_AMBULATORY_CARE_PROVIDER_SITE_OTHER): Payer: Medicare Other | Admitting: *Deleted

## 2010-09-06 DIAGNOSIS — Z7901 Long term (current) use of anticoagulants: Secondary | ICD-10-CM

## 2010-09-06 DIAGNOSIS — I4891 Unspecified atrial fibrillation: Secondary | ICD-10-CM

## 2010-09-06 LAB — POCT INR: INR: 1.9

## 2010-09-15 ENCOUNTER — Encounter: Payer: Self-pay | Admitting: Cardiology

## 2010-09-22 ENCOUNTER — Encounter: Payer: Self-pay | Admitting: Cardiology

## 2010-09-22 ENCOUNTER — Ambulatory Visit (INDEPENDENT_AMBULATORY_CARE_PROVIDER_SITE_OTHER): Payer: Medicare Other | Admitting: Cardiology

## 2010-09-22 VITALS — BP 132/82 | HR 75 | Ht 65.0 in | Wt 107.0 lb

## 2010-09-22 DIAGNOSIS — I10 Essential (primary) hypertension: Secondary | ICD-10-CM

## 2010-09-22 DIAGNOSIS — I4891 Unspecified atrial fibrillation: Secondary | ICD-10-CM

## 2010-09-22 NOTE — Patient Instructions (Signed)
   Your physician wants you to follow-up in: 3 months. You will receive a reminder letter in the mail one-two months in advance. If you don't receive a letter, please call our office to schedule the follow-up appointment.  Your physician recommends that you continue on your current medications as directed. Please refer to the Current Medication list given to you today.  

## 2010-09-22 NOTE — Progress Notes (Signed)
Clinical Summary Jasmine Watkins is a 77 y.o.female presenting for followup. She was seen in May of this year.  She is here with her daughters. Reports no falls using her walker. No major bleeding problems on Coumadin. She reports no sense of palpitations or chest pain. Feels generally "weak" as before, but no major changes.  I rechecked her blood pressure today with results noted below.   No Known Allergies  Current outpatient prescriptions:acetaminophen (TYLENOL) 650 MG CR tablet, Take 650 mg by mouth every 6 (six) hours.  , Disp: , Rfl: ;  calcium-vitamin D (OSCAL WITH D) 500-200 MG-UNIT per tablet, Take 1 tablet by mouth 2 (two) times daily.  , Disp: , Rfl: ;  carvedilol (COREG) 25 MG tablet, Take 25 mg by mouth 2 (two) times daily with a meal.  , Disp: , Rfl:  Cholecalciferol (VITAMIN D3) 1000 UNITS CAPS, Take 1 capsule by mouth daily.  , Disp: , Rfl: ;  furosemide (LASIX) 20 MG tablet, Take 20 mg by mouth daily.  , Disp: , Rfl: ;  Multiple Vitamin (MULTIVITAMIN) tablet, Take 1 tablet by mouth daily.  , Disp: , Rfl: ;  SYNTHROID 75 MCG tablet, Take 75 tablets by mouth Daily., Disp: , Rfl: ;  timolol (TIMOPTIC-XR) 0.5 % ophthalmic gel-forming, Place 1 drop into both eyes Daily., Disp: , Rfl:  traMADol (ULTRAM) 50 MG tablet, Take 50 mg by mouth every 6 (six) hours as needed.  , Disp: , Rfl: ;  warfarin (COUMADIN) 5 MG tablet, Take by mouth as directed.  , Disp: , Rfl:   Past Medical History  Diagnosis Date  . Atrial fibrillation   . Chronic systolic heart failure     LVEF 45-50%  . Essential hypertension, benign   . Hypothyroidism   . Gout   . Osteoporosis     Past Surgical History  Procedure Date  . Thyroidectomy   . Lumbar fusion   . Cataract surgery     Family History  Problem Relation Age of Onset  . Hypertension      Social History Ms. Fennimore reports that she has never smoked. She has never used smokeless tobacco. Ms. Friel reports that she does not drink  alcohol.  Review of Systems Reportedly stable appetite. No melena or hematochezia. Otherwise negative.  Physical Examination Filed Vitals:   09/22/10 1358  BP: 132/82  Pulse: 75   Thin elderly woman in no acute distress. Hard of hearing.  HEENT: Conjunctiva and lids normal, oropharynx with moist mucosa.  Neck: Supple, no elevated venous pressure.  Lungs: Clear to auscultation, nonlabored breathing.  Cardiac: Regular with ectopic beats. Indistinct PMI. No S3 gallop.  Extremities: Trace ankle edema, nonpitting. Distal pulses one plus.  Skin: Warm and dry.  Musculoskeletal: Kyphosis.  Neuropsychiatric: Hard of hearing, alert and oriented x2.   ECG See dictated report.   Problem List and Plan

## 2010-09-22 NOTE — Assessment & Plan Note (Signed)
Blood pressure rechecked today. No changes made. Discussed sodium restriction.

## 2010-09-22 NOTE — Assessment & Plan Note (Signed)
Symptomatically stable with adequate heart rate control on examination. ECG also is stable. Plan to continue present medical regimen including Coumadin. Discussed fall risk. Maintain regular followup.

## 2010-10-04 ENCOUNTER — Ambulatory Visit (INDEPENDENT_AMBULATORY_CARE_PROVIDER_SITE_OTHER): Payer: Medicare Other | Admitting: *Deleted

## 2010-10-04 DIAGNOSIS — Z7901 Long term (current) use of anticoagulants: Secondary | ICD-10-CM

## 2010-10-04 DIAGNOSIS — I4891 Unspecified atrial fibrillation: Secondary | ICD-10-CM

## 2010-11-01 ENCOUNTER — Ambulatory Visit (INDEPENDENT_AMBULATORY_CARE_PROVIDER_SITE_OTHER): Payer: Medicare Other | Admitting: *Deleted

## 2010-11-01 DIAGNOSIS — I4891 Unspecified atrial fibrillation: Secondary | ICD-10-CM

## 2010-11-01 DIAGNOSIS — Z7901 Long term (current) use of anticoagulants: Secondary | ICD-10-CM

## 2010-11-01 LAB — POCT INR: INR: 1.6

## 2010-11-18 ENCOUNTER — Ambulatory Visit (INDEPENDENT_AMBULATORY_CARE_PROVIDER_SITE_OTHER): Payer: Medicare Other | Admitting: *Deleted

## 2010-11-18 DIAGNOSIS — I4891 Unspecified atrial fibrillation: Secondary | ICD-10-CM

## 2010-11-18 DIAGNOSIS — Z7901 Long term (current) use of anticoagulants: Secondary | ICD-10-CM

## 2010-11-18 LAB — POCT INR: INR: 1.9

## 2010-12-09 ENCOUNTER — Ambulatory Visit (INDEPENDENT_AMBULATORY_CARE_PROVIDER_SITE_OTHER): Payer: Medicare Other | Admitting: *Deleted

## 2010-12-09 DIAGNOSIS — I4891 Unspecified atrial fibrillation: Secondary | ICD-10-CM

## 2010-12-09 DIAGNOSIS — Z7901 Long term (current) use of anticoagulants: Secondary | ICD-10-CM

## 2010-12-09 LAB — POCT INR: INR: 2.4

## 2010-12-22 ENCOUNTER — Encounter: Payer: Self-pay | Admitting: Cardiology

## 2010-12-27 ENCOUNTER — Ambulatory Visit (INDEPENDENT_AMBULATORY_CARE_PROVIDER_SITE_OTHER): Payer: Medicare Other | Admitting: *Deleted

## 2010-12-27 ENCOUNTER — Encounter: Payer: Self-pay | Admitting: Cardiology

## 2010-12-27 ENCOUNTER — Ambulatory Visit (INDEPENDENT_AMBULATORY_CARE_PROVIDER_SITE_OTHER): Payer: Medicare Other | Admitting: Cardiology

## 2010-12-27 DIAGNOSIS — I10 Essential (primary) hypertension: Secondary | ICD-10-CM

## 2010-12-27 DIAGNOSIS — I5022 Chronic systolic (congestive) heart failure: Secondary | ICD-10-CM

## 2010-12-27 DIAGNOSIS — Z79899 Other long term (current) drug therapy: Secondary | ICD-10-CM

## 2010-12-27 DIAGNOSIS — I4891 Unspecified atrial fibrillation: Secondary | ICD-10-CM

## 2010-12-27 DIAGNOSIS — Z7901 Long term (current) use of anticoagulants: Secondary | ICD-10-CM

## 2010-12-27 LAB — POCT INR: INR: 1.9

## 2010-12-27 MED ORDER — RAMIPRIL 5 MG PO TABS: 5.0000 mg | ORAL_TABLET | Freq: Every day | ORAL | Status: DC

## 2010-12-27 NOTE — Assessment & Plan Note (Signed)
Continue medical therapy, beginning trial of ACE inhibitor as well.

## 2010-12-27 NOTE — Assessment & Plan Note (Signed)
Symptomatically stable on medical therapy including rate control and anticoagulation.

## 2010-12-27 NOTE — Progress Notes (Signed)
Clinical Summary Jasmine Watkins is a 77 y.o.female presenting for followup. She was seen in July.  She is here with her 2 daughters. Within the last week she has moved into an assisted living facility, reports enjoying it there.. She describes NYHA class III dyspnea on exertion. No falls using a walker. She denies any angina or significant palpitations, no bleeding problems.  Blood pressure remains elevated. We discussed medication adjustments aimed at better blood pressure control and afterload reduction.   No Known Allergies  Medication list reviewed.  Past Medical History  Diagnosis Date  . Atrial fibrillation   . Chronic systolic heart failure     LVEF 45-50%  . Essential hypertension, benign   . Hypothyroidism   . Gout   . Osteoporosis     Past Surgical History  Procedure Date  . Thyroidectomy   . Lumbar fusion   . Cataract surgery     Family History  Problem Relation Age of Onset  . Hypertension      Social History [name] reports that she has never smoked. She has never used smokeless tobacco. Ms. Few reports that she does not drink alcohol.  Review of Systems As outlined above. Otherwise negative.  Physical Examination Filed Vitals:   12/27/10 1030  BP: 177/72  Pulse: 68    Thin elderly woman in no acute distress. Hard of hearing.  HEENT: Conjunctiva and lids normal, oropharynx with moist mucosa.  Neck: Supple, no elevated venous pressure.  Lungs: Clear to auscultation, nonlabored breathing.  Cardiac: Regular with ectopic beats. Indistinct PMI. No S3 gallop.  Extremities: Trace ankle edema, nonpitting. Distal pulses one plus.  Skin: Warm and dry.  Musculoskeletal: Kyphosis.  Neuropsychiatric: Hard of hearing, alert and oriented x2.    Problem List and Plan

## 2010-12-27 NOTE — Patient Instructions (Signed)
Your physician wants you to follow-up in: 3 months. You will receive a reminder letter in the mail one-two months in advance. If you don't receive a letter, please call our office to schedule the follow-up appointment. Start Altace (ramipril) 5 mg daily. Your physician recommends that you go to the [facility] for lab work: BMET-DO IN 2 WEEKS. If the results of your test are normal or stable, you will receive a letter. If they are abnormal, the nurse will contact you by phone.

## 2010-12-27 NOTE — Assessment & Plan Note (Addendum)
Blood pressure not optimally controlled in light of cardiomyopathy. Plan to initiate Altace at 5 mg daily, followup BMET in 2 weeks. Creatinine was 1.0 with GFR of 49 as of June.

## 2011-01-24 ENCOUNTER — Ambulatory Visit (INDEPENDENT_AMBULATORY_CARE_PROVIDER_SITE_OTHER): Payer: Medicare Other | Admitting: *Deleted

## 2011-01-24 DIAGNOSIS — I4891 Unspecified atrial fibrillation: Secondary | ICD-10-CM

## 2011-01-24 DIAGNOSIS — Z7901 Long term (current) use of anticoagulants: Secondary | ICD-10-CM

## 2011-01-24 LAB — POCT INR: INR: 1.9

## 2011-02-14 ENCOUNTER — Ambulatory Visit (INDEPENDENT_AMBULATORY_CARE_PROVIDER_SITE_OTHER): Payer: Medicare Other | Admitting: *Deleted

## 2011-02-14 DIAGNOSIS — Z7901 Long term (current) use of anticoagulants: Secondary | ICD-10-CM

## 2011-02-14 DIAGNOSIS — I4891 Unspecified atrial fibrillation: Secondary | ICD-10-CM

## 2011-03-14 ENCOUNTER — Ambulatory Visit (INDEPENDENT_AMBULATORY_CARE_PROVIDER_SITE_OTHER): Payer: Medicare Other | Admitting: *Deleted

## 2011-03-14 DIAGNOSIS — I4891 Unspecified atrial fibrillation: Secondary | ICD-10-CM

## 2011-03-14 DIAGNOSIS — Z7901 Long term (current) use of anticoagulants: Secondary | ICD-10-CM

## 2011-03-28 ENCOUNTER — Ambulatory Visit (INDEPENDENT_AMBULATORY_CARE_PROVIDER_SITE_OTHER): Payer: Medicare Other | Admitting: *Deleted

## 2011-03-28 DIAGNOSIS — I4891 Unspecified atrial fibrillation: Secondary | ICD-10-CM

## 2011-03-28 DIAGNOSIS — Z7901 Long term (current) use of anticoagulants: Secondary | ICD-10-CM

## 2011-04-18 ENCOUNTER — Ambulatory Visit (INDEPENDENT_AMBULATORY_CARE_PROVIDER_SITE_OTHER): Payer: Medicare Other | Admitting: *Deleted

## 2011-04-18 DIAGNOSIS — I4891 Unspecified atrial fibrillation: Secondary | ICD-10-CM

## 2011-04-18 DIAGNOSIS — Z7901 Long term (current) use of anticoagulants: Secondary | ICD-10-CM

## 2011-05-12 ENCOUNTER — Ambulatory Visit (INDEPENDENT_AMBULATORY_CARE_PROVIDER_SITE_OTHER): Payer: Medicare Other | Admitting: *Deleted

## 2011-05-12 DIAGNOSIS — I4891 Unspecified atrial fibrillation: Secondary | ICD-10-CM

## 2011-05-12 DIAGNOSIS — Z7901 Long term (current) use of anticoagulants: Secondary | ICD-10-CM

## 2011-06-06 ENCOUNTER — Ambulatory Visit (INDEPENDENT_AMBULATORY_CARE_PROVIDER_SITE_OTHER): Payer: Medicare Other | Admitting: *Deleted

## 2011-06-06 DIAGNOSIS — I4891 Unspecified atrial fibrillation: Secondary | ICD-10-CM

## 2011-06-06 DIAGNOSIS — Z7901 Long term (current) use of anticoagulants: Secondary | ICD-10-CM

## 2011-06-06 LAB — POCT INR: INR: 3.2

## 2011-06-20 ENCOUNTER — Other Ambulatory Visit: Payer: Self-pay | Admitting: *Deleted

## 2011-06-20 MED ORDER — RAMIPRIL 5 MG PO TABS: 5.0000 mg | ORAL_TABLET | Freq: Every day | ORAL | Status: DC

## 2011-07-04 ENCOUNTER — Ambulatory Visit (INDEPENDENT_AMBULATORY_CARE_PROVIDER_SITE_OTHER): Payer: Medicare Other | Admitting: *Deleted

## 2011-07-04 DIAGNOSIS — I4891 Unspecified atrial fibrillation: Secondary | ICD-10-CM

## 2011-07-04 DIAGNOSIS — Z7901 Long term (current) use of anticoagulants: Secondary | ICD-10-CM

## 2011-07-14 ENCOUNTER — Encounter: Payer: Self-pay | Admitting: Cardiology

## 2011-07-14 ENCOUNTER — Ambulatory Visit (INDEPENDENT_AMBULATORY_CARE_PROVIDER_SITE_OTHER): Payer: Medicare Other | Admitting: Cardiology

## 2011-07-14 ENCOUNTER — Ambulatory Visit (INDEPENDENT_AMBULATORY_CARE_PROVIDER_SITE_OTHER): Payer: Medicare Other | Admitting: *Deleted

## 2011-07-14 VITALS — BP 131/67 | HR 70 | Ht 65.0 in | Wt 119.0 lb

## 2011-07-14 DIAGNOSIS — I4891 Unspecified atrial fibrillation: Secondary | ICD-10-CM

## 2011-07-14 DIAGNOSIS — Z7901 Long term (current) use of anticoagulants: Secondary | ICD-10-CM

## 2011-07-14 DIAGNOSIS — I10 Essential (primary) hypertension: Secondary | ICD-10-CM

## 2011-07-14 DIAGNOSIS — I5022 Chronic systolic (congestive) heart failure: Secondary | ICD-10-CM

## 2011-07-14 MED ORDER — RAMIPRIL 5 MG PO TABS: 5.0000 mg | ORAL_TABLET | Freq: Every day | ORAL | Status: DC

## 2011-07-14 NOTE — Assessment & Plan Note (Signed)
Patient in sinus rhythm today. Continue current regimen including Coreg and Coumadin. Followup in 6 months.

## 2011-07-14 NOTE — Assessment & Plan Note (Signed)
Her weight is up, although may be related to improve appetite. Does not appear to be a markedly volume overloaded, and symptomatically she is quite stable. No change in current regimen including Coreg and Altace.

## 2011-07-14 NOTE — Progress Notes (Signed)
Clinical Summary Ms. Sutley is a 78 y.o.female presenting for followup. She was seen in October 2012. We started Altace at the last visit. Subsequent lab work showed creatinine 1.1, potassium 4.2. Her blood pressure is better controlled today.  She is here with 2 daughters, has been doing quite well by report. Daughters indicate that she enjoys her current living situation, ambulates easily with her walker, has had no falls. She indicates no chest pain or progressive breathlessness. Seems to be tolerating her medications, reports good appetite. Increase in weight reflects this. She denies any significant edema or orthopnea.   No Known Allergies  Current Outpatient Prescriptions  Medication Sig Dispense Refill  . acetaminophen (TYLENOL) 650 MG CR tablet Take 650 mg by mouth every 6 (six) hours.        . calcium-vitamin D (OSCAL WITH D) 500-200 MG-UNIT per tablet Take 1 tablet by mouth 2 (two) times daily.        . carvedilol (COREG) 25 MG tablet Take 25 mg by mouth 2 (two) times daily with a meal.        . Cholecalciferol (VITAMIN D3) 1000 UNITS CAPS Take 1 capsule by mouth daily.        . furosemide (LASIX) 20 MG tablet Take 20 mg by mouth daily.        . Multiple Vitamin (MULTIVITAMIN) tablet Take 1 tablet by mouth daily.        . ramipril (ALTACE) 5 MG tablet Take 1 tablet (5 mg total) by mouth daily.  30 tablet  6  . SYNTHROID 75 MCG tablet Take 75 tablets by mouth Daily.      . timolol (TIMOPTIC-XR) 0.5 % ophthalmic gel-forming Place 1 drop into both eyes Daily.      . traMADol (ULTRAM) 50 MG tablet Take 50 mg by mouth every 6 (six) hours as needed.        . warfarin (COUMADIN) 5 MG tablet Take by mouth as directed. Managed by Misty Stanley      . DISCONTD: ramipril (ALTACE) 5 MG tablet Take 1 tablet (5 mg total) by mouth daily.  30 tablet  3    Past Medical History  Diagnosis Date  . Atrial fibrillation   . Chronic systolic heart failure     LVEF 45-50%  . Essential hypertension, benign    . Hypothyroidism   . Gout   . Osteoporosis     Social History Ms. Denslow reports that she has never smoked. She has never used smokeless tobacco. Ms. Maxham reports that she does not drink alcohol.  Review of Systems Hard of hearing. No reported bleeding problems. Otherwise negative except as outlined.  Physical Examination Filed Vitals:   07/14/11 1420  BP: 131/67  Pulse: 70    Thin elderly woman in no acute distress. Hard of hearing.  HEENT: Conjunctiva and lids normal, oropharynx with moist mucosa.  Neck: Supple, no elevated venous pressure.  Lungs: Clear to auscultation, nonlabored breathing.  Cardiac: Regular with ectopic beats. Indistinct PMI. No S3 gallop.  Extremities: Trace ankle edema, nonpitting. Distal pulses one plus.  Skin: Warm and dry.  Musculoskeletal: Kyphosis.  Neuropsychiatric: Hard of hearing, alert and oriented x2.   ECG Reviewed in EMR.  Problem List and Plan   ATRIAL FIBRILLATION Patient in sinus rhythm today. Continue current regimen including Coreg and Coumadin. Followup in 6 months.  SYSTOLIC HEART FAILURE, CHRONIC Her weight is up, although may be related to improve appetite. Does not appear to be a markedly  volume overloaded, and symptomatically she is quite stable. No change in current regimen including Coreg and Altace.  Essential hypertension, benign Blood pressure trend is better following addition of Altace. Continue same. Refill provided.     Jonelle Sidle, M.D., F.A.C.C.

## 2011-07-14 NOTE — Assessment & Plan Note (Signed)
Blood pressure trend is better following addition of Altace. Continue same. Refill provided.

## 2011-07-14 NOTE — Patient Instructions (Signed)
Your physician wants you to follow-up in: 6 months. You will receive a reminder letter in the mail one-two months in advance. If you don't receive a letter, please call our office to schedule the follow-up appointment. Your physician recommends that you continue on your current medications as directed. Please refer to the Current Medication list given to you today. 

## 2011-08-01 ENCOUNTER — Ambulatory Visit (INDEPENDENT_AMBULATORY_CARE_PROVIDER_SITE_OTHER): Payer: Medicare Other | Admitting: *Deleted

## 2011-08-01 DIAGNOSIS — I4891 Unspecified atrial fibrillation: Secondary | ICD-10-CM

## 2011-08-01 DIAGNOSIS — Z7901 Long term (current) use of anticoagulants: Secondary | ICD-10-CM

## 2011-08-22 ENCOUNTER — Ambulatory Visit (INDEPENDENT_AMBULATORY_CARE_PROVIDER_SITE_OTHER): Payer: Medicare Other | Admitting: *Deleted

## 2011-08-22 DIAGNOSIS — I4891 Unspecified atrial fibrillation: Secondary | ICD-10-CM

## 2011-08-22 DIAGNOSIS — Z7901 Long term (current) use of anticoagulants: Secondary | ICD-10-CM

## 2011-08-22 LAB — POCT INR: INR: 2.2

## 2011-09-15 ENCOUNTER — Ambulatory Visit (INDEPENDENT_AMBULATORY_CARE_PROVIDER_SITE_OTHER): Payer: Medicare Other | Admitting: *Deleted

## 2011-09-15 DIAGNOSIS — Z7901 Long term (current) use of anticoagulants: Secondary | ICD-10-CM

## 2011-09-15 DIAGNOSIS — I4891 Unspecified atrial fibrillation: Secondary | ICD-10-CM

## 2011-10-13 ENCOUNTER — Ambulatory Visit (INDEPENDENT_AMBULATORY_CARE_PROVIDER_SITE_OTHER): Payer: Medicare Other | Admitting: *Deleted

## 2011-10-13 DIAGNOSIS — I4891 Unspecified atrial fibrillation: Secondary | ICD-10-CM

## 2011-10-13 DIAGNOSIS — Z7901 Long term (current) use of anticoagulants: Secondary | ICD-10-CM

## 2011-11-10 ENCOUNTER — Ambulatory Visit (INDEPENDENT_AMBULATORY_CARE_PROVIDER_SITE_OTHER): Payer: Medicare Other | Admitting: *Deleted

## 2011-11-10 DIAGNOSIS — I4891 Unspecified atrial fibrillation: Secondary | ICD-10-CM

## 2011-11-10 DIAGNOSIS — Z7901 Long term (current) use of anticoagulants: Secondary | ICD-10-CM

## 2011-11-24 ENCOUNTER — Encounter (HOSPITAL_COMMUNITY): Payer: Self-pay | Admitting: Pharmacy Technician

## 2011-12-04 ENCOUNTER — Encounter (HOSPITAL_COMMUNITY): Admission: RE | Payer: Self-pay | Source: Ambulatory Visit

## 2011-12-04 ENCOUNTER — Ambulatory Visit (HOSPITAL_COMMUNITY): Admission: RE | Admit: 2011-12-04 | Payer: Medicare Other | Source: Ambulatory Visit | Admitting: Ophthalmology

## 2011-12-04 SURGERY — TREATMENT, USING YAG LASER
Anesthesia: LOCAL | Laterality: Left

## 2011-12-22 ENCOUNTER — Ambulatory Visit (INDEPENDENT_AMBULATORY_CARE_PROVIDER_SITE_OTHER): Payer: Medicare Other | Admitting: *Deleted

## 2011-12-22 DIAGNOSIS — Z7901 Long term (current) use of anticoagulants: Secondary | ICD-10-CM

## 2011-12-22 DIAGNOSIS — I4891 Unspecified atrial fibrillation: Secondary | ICD-10-CM

## 2012-01-10 ENCOUNTER — Encounter: Payer: Self-pay | Admitting: Cardiology

## 2012-01-10 ENCOUNTER — Ambulatory Visit (INDEPENDENT_AMBULATORY_CARE_PROVIDER_SITE_OTHER): Payer: Medicare Other | Admitting: Cardiology

## 2012-01-10 VITALS — BP 137/77 | HR 64 | Ht 65.0 in | Wt 125.4 lb

## 2012-01-10 DIAGNOSIS — I10 Essential (primary) hypertension: Secondary | ICD-10-CM

## 2012-01-10 DIAGNOSIS — I5022 Chronic systolic (congestive) heart failure: Secondary | ICD-10-CM

## 2012-01-10 DIAGNOSIS — I4891 Unspecified atrial fibrillation: Secondary | ICD-10-CM

## 2012-01-10 MED ORDER — RAMIPRIL 5 MG PO TABS: 5.0000 mg | ORAL_TABLET | Freq: Every day | ORAL | Status: DC

## 2012-01-10 NOTE — Patient Instructions (Addendum)

## 2012-01-10 NOTE — Assessment & Plan Note (Signed)
In sinus rhythm today, doing well on current medical regimen. No changes made.

## 2012-01-10 NOTE — Assessment & Plan Note (Signed)
Patient appears euvolemic, no progressive shortness of breath. LVEF 45-50% by last assessment.

## 2012-01-10 NOTE — Assessment & Plan Note (Signed)
Blood pressure looks reasonable today. Continue same. Refill given for ramipril.

## 2012-01-10 NOTE — Progress Notes (Signed)
Clinical Summary Jasmine Watkins is a 78 y.o.female presenting for followup. She was seen in May. She is here with her 2 daughters, still doing well living in an assisted living facility. She has had no falls or bleeding problems. Walks in the halls indoors, sometimes outside when the weather is good. She does not indicate any palpitations or chest pain. States that her appetite is good. No orthopnea or PND. No interval hospitalizations.  ECG today shows normal sinus rhythm with left axis and IVCD as noted previously. Needs refill for ramipril.   No Known Allergies  Current Outpatient Prescriptions  Medication Sig Dispense Refill  . acetaminophen (TYLENOL) 650 MG CR tablet Take 650 mg by mouth every 6 (six) hours.        . calcium-vitamin D (OSCAL WITH D) 500-200 MG-UNIT per tablet Take 1 tablet by mouth 2 (two) times daily.        . carvedilol (COREG) 25 MG tablet Take 25 mg by mouth 2 (two) times daily with a meal.        . Cholecalciferol (VITAMIN D3) 1000 UNITS CAPS Take 1 capsule by mouth daily.        . furosemide (LASIX) 20 MG tablet Take 20 mg by mouth daily.        . Multiple Vitamin (MULTIVITAMIN) tablet Take 1 tablet by mouth daily.        . ramipril (ALTACE) 5 MG tablet Take 1 tablet (5 mg total) by mouth daily.  90 tablet  3  . SYNTHROID 75 MCG tablet Take 75 tablets by mouth Daily.      . timolol (TIMOPTIC-XR) 0.5 % ophthalmic gel-forming Place 1 drop into both eyes Daily.      . traMADol (ULTRAM) 50 MG tablet Take 50 mg by mouth every 6 (six) hours as needed.        . warfarin (COUMADIN) 5 MG tablet Take by mouth as directed. Managed by Misty Stanley      . [DISCONTINUED] ramipril (ALTACE) 5 MG tablet Take 1 tablet (5 mg total) by mouth daily.  30 tablet  6    Past Medical History  Diagnosis Date  . Atrial fibrillation   . Chronic systolic heart failure     LVEF 45-50%  . Essential hypertension, benign   . Hypothyroidism   . Gout   . Osteoporosis     Social History Ms.  Chismar reports that she has never smoked. She has never used smokeless tobacco. Ms. Sakuma reports that she does not drink alcohol.  Review of Systems Part of hearing, no melena or hematochezia. No syncope. Otherwise negative.  Physical Examination Filed Vitals:   01/10/12 0924  BP: 137/77  Pulse: 64   Filed Weights   01/10/12 0924  Weight: 125 lb 6.4 oz (56.881 kg)    Thin elderly woman in no acute distress. Hard of hearing.  HEENT: Conjunctiva and lids normal, oropharynx with moist mucosa.  Neck: Supple, no elevated venous pressure.  Lungs: Clear to auscultation, nonlabored breathing.  Cardiac: Regular with ectopic beats. Indistinct PMI. No S3 gallop.  Extremities: Trace ankle edema, nonpitting. Distal pulses one plus.  Skin: Warm and dry.  Musculoskeletal: Kyphosis.    Problem List and Plan   ATRIAL FIBRILLATION In sinus rhythm today, doing well on current medical regimen. No changes made.  Essential hypertension, benign Blood pressure looks reasonable today. Continue same. Refill given for ramipril.  SYSTOLIC HEART FAILURE, CHRONIC Patient appears euvolemic, no progressive shortness of breath. LVEF 45-50% by  last assessment.    Jonelle Sidle, M.D., F.A.C.C.

## 2012-01-12 ENCOUNTER — Ambulatory Visit (INDEPENDENT_AMBULATORY_CARE_PROVIDER_SITE_OTHER): Payer: Medicare Other | Admitting: *Deleted

## 2012-01-12 DIAGNOSIS — Z7901 Long term (current) use of anticoagulants: Secondary | ICD-10-CM

## 2012-01-12 DIAGNOSIS — I4891 Unspecified atrial fibrillation: Secondary | ICD-10-CM

## 2012-02-06 ENCOUNTER — Ambulatory Visit (INDEPENDENT_AMBULATORY_CARE_PROVIDER_SITE_OTHER): Payer: Medicare Other | Admitting: *Deleted

## 2012-02-06 DIAGNOSIS — Z7901 Long term (current) use of anticoagulants: Secondary | ICD-10-CM

## 2012-02-06 DIAGNOSIS — I4891 Unspecified atrial fibrillation: Secondary | ICD-10-CM

## 2012-02-06 LAB — POCT INR: INR: 3

## 2012-03-05 ENCOUNTER — Ambulatory Visit (INDEPENDENT_AMBULATORY_CARE_PROVIDER_SITE_OTHER): Payer: Medicare Other | Admitting: *Deleted

## 2012-03-05 DIAGNOSIS — Z7901 Long term (current) use of anticoagulants: Secondary | ICD-10-CM

## 2012-03-05 DIAGNOSIS — I4891 Unspecified atrial fibrillation: Secondary | ICD-10-CM

## 2012-03-05 LAB — POCT INR: INR: 2.2

## 2012-03-21 ENCOUNTER — Encounter: Payer: Self-pay | Admitting: Cardiology

## 2012-03-21 ENCOUNTER — Encounter: Payer: Self-pay | Admitting: Physician Assistant

## 2012-03-21 DIAGNOSIS — Z01810 Encounter for preprocedural cardiovascular examination: Secondary | ICD-10-CM

## 2012-04-02 ENCOUNTER — Ambulatory Visit (INDEPENDENT_AMBULATORY_CARE_PROVIDER_SITE_OTHER): Payer: Medicare Other | Admitting: *Deleted

## 2012-04-02 DIAGNOSIS — I4891 Unspecified atrial fibrillation: Secondary | ICD-10-CM

## 2012-04-02 DIAGNOSIS — Z7901 Long term (current) use of anticoagulants: Secondary | ICD-10-CM

## 2012-04-30 ENCOUNTER — Ambulatory Visit (INDEPENDENT_AMBULATORY_CARE_PROVIDER_SITE_OTHER): Payer: Medicare Other | Admitting: *Deleted

## 2012-04-30 DIAGNOSIS — Z7901 Long term (current) use of anticoagulants: Secondary | ICD-10-CM

## 2012-04-30 DIAGNOSIS — I4891 Unspecified atrial fibrillation: Secondary | ICD-10-CM

## 2012-04-30 LAB — POCT INR: INR: 2.1

## 2012-06-11 ENCOUNTER — Ambulatory Visit (INDEPENDENT_AMBULATORY_CARE_PROVIDER_SITE_OTHER): Payer: Medicare Other | Admitting: *Deleted

## 2012-06-11 DIAGNOSIS — Z7901 Long term (current) use of anticoagulants: Secondary | ICD-10-CM

## 2012-06-11 DIAGNOSIS — I4891 Unspecified atrial fibrillation: Secondary | ICD-10-CM

## 2012-06-11 LAB — POCT INR: INR: 1.7

## 2012-07-02 ENCOUNTER — Ambulatory Visit (INDEPENDENT_AMBULATORY_CARE_PROVIDER_SITE_OTHER): Payer: Medicare Other | Admitting: *Deleted

## 2012-07-02 DIAGNOSIS — Z7901 Long term (current) use of anticoagulants: Secondary | ICD-10-CM

## 2012-07-02 DIAGNOSIS — I4891 Unspecified atrial fibrillation: Secondary | ICD-10-CM

## 2012-07-02 LAB — POCT INR: INR: 1.9

## 2012-07-12 ENCOUNTER — Encounter: Payer: Self-pay | Admitting: Cardiology

## 2012-07-12 ENCOUNTER — Ambulatory Visit (INDEPENDENT_AMBULATORY_CARE_PROVIDER_SITE_OTHER): Payer: Medicare Other | Admitting: Cardiology

## 2012-07-12 VITALS — BP 107/55 | HR 81 | Ht 65.0 in | Wt 125.0 lb

## 2012-07-12 DIAGNOSIS — I5022 Chronic systolic (congestive) heart failure: Secondary | ICD-10-CM

## 2012-07-12 DIAGNOSIS — I4891 Unspecified atrial fibrillation: Secondary | ICD-10-CM

## 2012-07-12 NOTE — Assessment & Plan Note (Signed)
Weight is stable, clinically no obvious progression in shortness of breath or increasing leg edema. No change in current regimen.

## 2012-07-12 NOTE — Progress Notes (Signed)
   Clinical Summary Ms. Daughety is a 79 y.o.female last seen in November 2013. She is here for regular followup with her 2 daughters. History is limited by decreased hearing and also dementia. Her daughters state that she seems to be doing well at the nursing facility, eats regularly, ambulates with her walker, no falls. She has been getting her medications regularly.  Lab work from January revealed BUN 21, creatinine 1.2, GFR 39, AST 15, ALT 10, potassium 4.3, hemoglobin 12.0.  ECG shows sinus rhythm with frequent ventricular ectopy. No obvious dizziness or syncope.   No Known Allergies  Current Outpatient Prescriptions  Medication Sig Dispense Refill  . acetaminophen (TYLENOL) 650 MG CR tablet Take 650 mg by mouth every 6 (six) hours.        . calcium-vitamin D (OSCAL WITH D) 500-200 MG-UNIT per tablet Take 1 tablet by mouth 2 (two) times daily.        . carvedilol (COREG) 25 MG tablet Take 25 mg by mouth 2 (two) times daily with a meal.        . Cholecalciferol (VITAMIN D3) 1000 UNITS CAPS Take 1 capsule by mouth daily.        . furosemide (LASIX) 20 MG tablet Take 20 mg by mouth daily.        . Multiple Vitamin (MULTIVITAMIN) tablet Take 1 tablet by mouth daily.        . ramipril (ALTACE) 5 MG tablet Take 1 tablet (5 mg total) by mouth daily.  90 tablet  3  . SYNTHROID 75 MCG tablet Take 75 tablets by mouth Daily.      . timolol (TIMOPTIC-XR) 0.5 % ophthalmic gel-forming Place 1 drop into both eyes Daily.      . traMADol (ULTRAM) 50 MG tablet Take 50 mg by mouth every 6 (six) hours as needed.        . warfarin (COUMADIN) 5 MG tablet Take by mouth as directed. Managed by Misty Stanley       No current facility-administered medications for this visit.    Past Medical History  Diagnosis Date  . Atrial fibrillation   . Chronic systolic heart failure     LVEF 45-50%  . Essential hypertension, benign   . Hypothyroidism   . Gout   . Osteoporosis     Social History Ms. Bickle reports that  she has never smoked. She has never used smokeless tobacco. Ms. Lutze reports that she does not drink alcohol.  Review of Systems As outlined above, otherwise negative.  Physical Examination Filed Vitals:   07/12/12 1311  BP: 107/55  Pulse: 81   Filed Weights   07/12/12 1311  Weight: 125 lb (56.7 kg)    Thin elderly woman in no acute distress. Hard of hearing.  HEENT: Conjunctiva and lids normal, oropharynx with moist mucosa.  Neck: Supple, no elevated venous pressure.  Lungs: Clear to auscultation, nonlabored breathing.  Cardiac: Regular with ectopic beats. Indistinct PMI. No S3 gallop.  Extremities: Trace ankle edema, nonpitting. Distal pulses one plus.  Skin: Warm and dry.  Musculoskeletal: Kyphosis.    Problem List and Plan   Atrial fibrillation Sinus rhythm today with frequent PVCs. No change in current regimen.  SYSTOLIC HEART FAILURE, CHRONIC Weight is stable, clinically no obvious progression in shortness of breath or increasing leg edema. No change in current regimen.    Jonelle Sidle, M.D., F.A.C.C.

## 2012-07-12 NOTE — Patient Instructions (Addendum)

## 2012-07-12 NOTE — Assessment & Plan Note (Signed)
Sinus rhythm today with frequent PVCs. No change in current regimen.

## 2012-07-16 ENCOUNTER — Ambulatory Visit: Payer: Medicare Other | Admitting: Cardiology

## 2012-07-23 ENCOUNTER — Ambulatory Visit (INDEPENDENT_AMBULATORY_CARE_PROVIDER_SITE_OTHER): Payer: Medicare Other | Admitting: *Deleted

## 2012-07-23 DIAGNOSIS — I4891 Unspecified atrial fibrillation: Secondary | ICD-10-CM

## 2012-07-23 DIAGNOSIS — Z7901 Long term (current) use of anticoagulants: Secondary | ICD-10-CM

## 2012-08-02 ENCOUNTER — Other Ambulatory Visit: Payer: Self-pay | Admitting: Cardiology

## 2012-08-20 ENCOUNTER — Ambulatory Visit (INDEPENDENT_AMBULATORY_CARE_PROVIDER_SITE_OTHER): Payer: Medicare Other | Admitting: *Deleted

## 2012-08-20 DIAGNOSIS — I4891 Unspecified atrial fibrillation: Secondary | ICD-10-CM

## 2012-08-20 DIAGNOSIS — Z7901 Long term (current) use of anticoagulants: Secondary | ICD-10-CM

## 2012-09-17 ENCOUNTER — Ambulatory Visit (INDEPENDENT_AMBULATORY_CARE_PROVIDER_SITE_OTHER): Payer: Medicare Other | Admitting: *Deleted

## 2012-09-17 DIAGNOSIS — Z7901 Long term (current) use of anticoagulants: Secondary | ICD-10-CM

## 2012-09-17 DIAGNOSIS — I4891 Unspecified atrial fibrillation: Secondary | ICD-10-CM

## 2012-09-27 ENCOUNTER — Other Ambulatory Visit: Payer: Self-pay | Admitting: Cardiology

## 2012-09-27 NOTE — Telephone Encounter (Signed)
Medication sent via escribe for ramipril.

## 2012-10-15 ENCOUNTER — Ambulatory Visit (INDEPENDENT_AMBULATORY_CARE_PROVIDER_SITE_OTHER): Payer: Medicare Other | Admitting: *Deleted

## 2012-10-15 DIAGNOSIS — Z7901 Long term (current) use of anticoagulants: Secondary | ICD-10-CM

## 2012-10-15 DIAGNOSIS — I4891 Unspecified atrial fibrillation: Secondary | ICD-10-CM

## 2012-10-21 ENCOUNTER — Encounter: Payer: Self-pay | Admitting: Physician Assistant

## 2012-10-21 DIAGNOSIS — R05 Cough: Secondary | ICD-10-CM

## 2012-10-22 ENCOUNTER — Encounter: Payer: Self-pay | Admitting: Physician Assistant

## 2012-10-22 ENCOUNTER — Encounter: Payer: Self-pay | Admitting: Cardiology

## 2012-10-22 DIAGNOSIS — I509 Heart failure, unspecified: Secondary | ICD-10-CM

## 2012-10-22 DIAGNOSIS — I499 Cardiac arrhythmia, unspecified: Secondary | ICD-10-CM

## 2012-10-23 DIAGNOSIS — I5033 Acute on chronic diastolic (congestive) heart failure: Secondary | ICD-10-CM

## 2012-10-29 ENCOUNTER — Ambulatory Visit (INDEPENDENT_AMBULATORY_CARE_PROVIDER_SITE_OTHER): Payer: Medicare Other | Admitting: *Deleted

## 2012-10-29 DIAGNOSIS — I4891 Unspecified atrial fibrillation: Secondary | ICD-10-CM

## 2012-10-29 DIAGNOSIS — Z7901 Long term (current) use of anticoagulants: Secondary | ICD-10-CM

## 2012-10-31 ENCOUNTER — Other Ambulatory Visit: Payer: Self-pay | Admitting: Cardiology

## 2012-10-31 NOTE — Telephone Encounter (Signed)
Gave verbal orders to refill Ramipril 5 mg qty of 30 with 4 refills.

## 2012-11-05 ENCOUNTER — Ambulatory Visit (INDEPENDENT_AMBULATORY_CARE_PROVIDER_SITE_OTHER): Payer: Medicare Other | Admitting: *Deleted

## 2012-11-05 DIAGNOSIS — I4891 Unspecified atrial fibrillation: Secondary | ICD-10-CM

## 2012-11-05 DIAGNOSIS — Z7901 Long term (current) use of anticoagulants: Secondary | ICD-10-CM

## 2012-11-05 LAB — POCT INR: INR: 2.9

## 2012-11-06 ENCOUNTER — Encounter: Payer: Self-pay | Admitting: Cardiology

## 2012-11-06 ENCOUNTER — Ambulatory Visit (INDEPENDENT_AMBULATORY_CARE_PROVIDER_SITE_OTHER): Payer: Medicare Other | Admitting: Cardiology

## 2012-11-06 VITALS — BP 102/61 | HR 69 | Ht 65.0 in | Wt 120.8 lb

## 2012-11-06 DIAGNOSIS — I5042 Chronic combined systolic (congestive) and diastolic (congestive) heart failure: Secondary | ICD-10-CM

## 2012-11-06 DIAGNOSIS — I4891 Unspecified atrial fibrillation: Secondary | ICD-10-CM

## 2012-11-06 NOTE — Patient Instructions (Addendum)
Your physician recommends that you schedule a follow-up appointment in: 4 months. You will receive a reminder letter in the mail in about 1-2 months reminding you to call and schedule your appointment. If you don't receive this letter, please contact our office. Your physician recommends that you continue on your current medications as directed. Please refer to the Current Medication list given to you today. 

## 2012-11-06 NOTE — Assessment & Plan Note (Signed)
She continues on Coumadin with strategy of heart rate control.

## 2012-11-06 NOTE — Progress Notes (Signed)
   Clinical Summary Ms. Mensing is a 79 y.o.female last seen in May. She is here with her 2 daughters. Records reviewed, she had a recent hospitalization at [facility] with acute on chronic diastolic heart failure, also Klebsiella UTI. She has been staying at a skilled nursing facility with transition back to assisted living anticipated. Her daughters haveeen staying with her.  Weight is down 5 pounds from last visit. Reported significant improvement in leg edema. She does not describe any breathlessness. Has almost completed antibiotics.  Recent echocardiogram in August demonstrated LVEF 55-60% with mild LVH, restrictive filling pattern with increased pressures, moderate to severe left atrial enlargement, moderate aortic stenosis, mild mitral regurgitation, moderate tricuspid regurgitation with RVSP 38 mm mercury, small pericardial effusion.   No Known Allergies  Current Outpatient Prescriptions  Medication Sig Dispense Refill  . acetaminophen (TYLENOL) 650 MG CR tablet Take 650 mg by mouth every 6 (six) hours.        . carvedilol (COREG) 25 MG tablet Take 25 mg by mouth 2 (two) times daily with a meal.        . furosemide (LASIX) 20 MG tablet Take 20 mg by mouth daily.        . ramipril (ALTACE) 5 MG capsule TAKE 1 CAPSULE BY MOUTH ONCE A DAY.  30 capsule  4  . SYNTHROID 75 MCG tablet Take 75 tablets by mouth Daily.      . timolol (TIMOPTIC-XR) 0.5 % ophthalmic gel-forming Place 1 drop into both eyes Daily.      Marland Kitchen warfarin (COUMADIN) 5 MG tablet Take by mouth as directed. Managed by Misty Stanley       No current facility-administered medications for this visit.    Past Medical History  Diagnosis Date  . Atrial fibrillation   . Chronic systolic heart failure     LVEF 45-50%  . Essential hypertension, benign   . Hypothyroidism   . Gout   . Osteoporosis     Social History Ms. Para reports that she has never smoked. She has never used smokeless tobacco. Ms. Siers reports that she does  not drink alcohol.  Review of Systems Negative except as outlined.  Physical Examination Filed Vitals:   11/06/12 1401  BP: 102/61  Pulse: 69   Filed Weights   11/06/12 1401  Weight: 120 lb 12.8 oz (54.795 kg)    Thin elderly woman in no acute distress. Hard of hearing.  HEENT: Conjunctiva and lids normal, oropharynx with moist mucosa.  Neck: Supple, no elevated venous pressure.  Lungs: Clear to auscultation, nonlabored breathing.  Cardiac: Regular with ectopic beats. Indistinct PMI. No S3 gallop.  Extremities: No edema. Distal pulses one plus.  Skin: Warm and dry.  Musculoskeletal: Kyphosis.    Problem List and Plan   Chronic combined systolic and diastolic heart failure Recent hospitalization noted. Patient has symptomatically improved. Plan to continue current medical regimen and observation. Her most recent echocardiogram shows restricted diastolic filling, although LVEF has improved to the normal range. I stressed the importance of obtaining regular weights and adjusting Lasix as needed. Followup arranged.  Atrial fibrillation She continues on Coumadin with strategy of heart rate control.    Jonelle Sidle, M.D., F.A.C.C.

## 2012-11-06 NOTE — Assessment & Plan Note (Signed)
Recent hospitalization noted. Patient has symptomatically improved. Plan to continue current medical regimen and observation. Her most recent echocardiogram shows restricted diastolic filling, although LVEF has improved to the normal range. I stressed the importance of obtaining regular weights and adjusting Lasix as needed. Followup arranged.

## 2012-11-15 ENCOUNTER — Ambulatory Visit (INDEPENDENT_AMBULATORY_CARE_PROVIDER_SITE_OTHER): Payer: Medicare Other | Admitting: *Deleted

## 2012-11-15 DIAGNOSIS — Z7901 Long term (current) use of anticoagulants: Secondary | ICD-10-CM

## 2012-11-15 DIAGNOSIS — I4891 Unspecified atrial fibrillation: Secondary | ICD-10-CM

## 2012-11-15 LAB — POCT INR
INR: 3.5
INR: 3.5

## 2012-11-29 ENCOUNTER — Ambulatory Visit (INDEPENDENT_AMBULATORY_CARE_PROVIDER_SITE_OTHER): Payer: Medicare Other | Admitting: *Deleted

## 2012-11-29 DIAGNOSIS — I4891 Unspecified atrial fibrillation: Secondary | ICD-10-CM

## 2012-11-29 DIAGNOSIS — Z7901 Long term (current) use of anticoagulants: Secondary | ICD-10-CM

## 2012-11-29 LAB — POCT INR: INR: 2.6

## 2012-12-20 ENCOUNTER — Ambulatory Visit (INDEPENDENT_AMBULATORY_CARE_PROVIDER_SITE_OTHER): Payer: Medicare Other | Admitting: *Deleted

## 2012-12-20 DIAGNOSIS — Z7901 Long term (current) use of anticoagulants: Secondary | ICD-10-CM

## 2012-12-20 DIAGNOSIS — I4891 Unspecified atrial fibrillation: Secondary | ICD-10-CM

## 2012-12-20 LAB — POCT INR: INR: 3.5

## 2013-01-10 ENCOUNTER — Ambulatory Visit (INDEPENDENT_AMBULATORY_CARE_PROVIDER_SITE_OTHER): Payer: Medicare Other | Admitting: *Deleted

## 2013-01-10 DIAGNOSIS — Z7901 Long term (current) use of anticoagulants: Secondary | ICD-10-CM

## 2013-01-10 DIAGNOSIS — I4891 Unspecified atrial fibrillation: Secondary | ICD-10-CM

## 2013-01-10 LAB — POCT INR: INR: 1.7

## 2013-01-24 ENCOUNTER — Ambulatory Visit (INDEPENDENT_AMBULATORY_CARE_PROVIDER_SITE_OTHER): Payer: Medicare Other | Admitting: *Deleted

## 2013-01-24 DIAGNOSIS — I4891 Unspecified atrial fibrillation: Secondary | ICD-10-CM

## 2013-01-24 DIAGNOSIS — Z7901 Long term (current) use of anticoagulants: Secondary | ICD-10-CM

## 2013-01-24 LAB — POCT INR: INR: 2.3

## 2013-02-21 ENCOUNTER — Ambulatory Visit (INDEPENDENT_AMBULATORY_CARE_PROVIDER_SITE_OTHER): Payer: Medicare Other | Admitting: Pharmacist

## 2013-02-21 DIAGNOSIS — I4891 Unspecified atrial fibrillation: Secondary | ICD-10-CM

## 2013-02-21 DIAGNOSIS — Z7901 Long term (current) use of anticoagulants: Secondary | ICD-10-CM

## 2013-02-21 LAB — POCT INR: INR: 4.4

## 2013-02-22 ENCOUNTER — Encounter: Payer: Self-pay | Admitting: Cardiology

## 2013-03-11 ENCOUNTER — Encounter: Payer: Self-pay | Admitting: Cardiology

## 2013-03-11 ENCOUNTER — Ambulatory Visit (INDEPENDENT_AMBULATORY_CARE_PROVIDER_SITE_OTHER): Payer: Medicare Other | Admitting: Cardiology

## 2013-03-11 ENCOUNTER — Ambulatory Visit (INDEPENDENT_AMBULATORY_CARE_PROVIDER_SITE_OTHER): Payer: Medicare Other | Admitting: *Deleted

## 2013-03-11 VITALS — BP 121/68 | HR 76 | Ht 65.0 in | Wt 115.1 lb

## 2013-03-11 DIAGNOSIS — I4891 Unspecified atrial fibrillation: Secondary | ICD-10-CM

## 2013-03-11 DIAGNOSIS — I10 Essential (primary) hypertension: Secondary | ICD-10-CM

## 2013-03-11 DIAGNOSIS — Z7901 Long term (current) use of anticoagulants: Secondary | ICD-10-CM

## 2013-03-11 DIAGNOSIS — I5042 Chronic combined systolic (congestive) and diastolic (congestive) heart failure: Secondary | ICD-10-CM

## 2013-03-11 LAB — POCT INR: INR: 4.6

## 2013-03-11 MED ORDER — RAMIPRIL 5 MG PO CAPS: 5.0000 mg | ORAL_CAPSULE | Freq: Every day | ORAL | Status: AC

## 2013-03-11 NOTE — Assessment & Plan Note (Signed)
She continues on Coumadin, paroxysmal arrhythmia with good rate control.

## 2013-03-11 NOTE — Assessment & Plan Note (Signed)
Blood pressure is normal today. 

## 2013-03-11 NOTE — Progress Notes (Signed)
    Clinical Summary Ms. Jasmine Watkins is a 80 y.o.female last seen in September 2014. She is here with her 2 daughters. Main complaints recently have been of decreased appetite and back pain, she is seeing a [facility]. Medicines are being given regularly.  Weight is down 5 pounds from September 2014.  Echocardiogram in August 2014 demonstrated LVEF 55-60% with mild LVH, restrictive filling pattern with increased pressures, moderate to severe left atrial enlargement, moderate aortic stenosis, mild mitral regurgitation, moderate tricuspid regurgitation with RVSP 38 mm mercury, small pericardial effusion.  Using a walker, no recent falls. No orthopnea or PND, no leg edema.   No Known Allergies  Current Outpatient Prescriptions  Medication Sig Dispense Refill  . acetaminophen (TYLENOL) 650 MG CR tablet Take 650 mg by mouth every 6 (six) hours.        . ALPRAZolam (XANAX) 0.25 MG tablet Take 0.25 mg by mouth at bedtime.       . carvedilol (COREG) 25 MG tablet Take 25 mg by mouth 2 (two) times daily with a meal.        . furosemide (LASIX) 20 MG tablet Take 20 mg by mouth daily.        . pantoprazole (PROTONIX) 40 MG tablet Take 40 mg by mouth daily.       . ramipril (ALTACE) 5 MG capsule Take 1 capsule (5 mg total) by mouth daily.  90 capsule  3  . SYNTHROID 75 MCG tablet Take 75 tablets by mouth Daily.      . timolol (TIMOPTIC-XR) 0.5 % ophthalmic gel-forming Place 1 drop into both eyes Daily.      Marland Kitchen. warfarin (COUMADIN) 5 MG tablet Take by mouth as directed. Managed by Misty StanleyLisa       No current facility-administered medications for this visit.    Past Medical History  Diagnosis Date  . Atrial fibrillation   . Chronic systolic heart failure     LVEF 45-50%  . Essential hypertension, benign   . Hypothyroidism   . Gout   . Osteoporosis     Social History Ms. Jasmine Watkins reports that she has never smoked. She has never used smokeless tobacco. Ms. Jasmine Watkins reports that she does not drink  alcohol.  Review of Systems Hard of hearing, otherwise as outlined.  Physical Examination Filed Vitals:   03/11/13 1138  BP: 121/68  Pulse: 76   Filed Weights   03/11/13 1138  Weight: 115 lb 1.9 oz (52.218 kg)    Thin elderly woman in no acute distress. Hard of hearing.  HEENT: Conjunctiva and lids normal, oropharynx with moist mucosa.  Neck: Supple, no elevated venous pressure.  Lungs: Clear to auscultation, nonlabored breathing.  Cardiac: Regular rate and rhythm. Indistinct PMI. No S3 gallop.  Extremities: No edema. Distal pulses one plus.  Skin: Warm and dry.  Musculoskeletal: Kyphosis.    Problem List and Plan   Atrial fibrillation She continues on Coumadin, paroxysmal arrhythmia with good rate control.  Chronic combined systolic and diastolic heart failure Clinically stable, no change made to current regimen including diuretic.  Essential hypertension, benign Blood pressure is normal today.    Jonelle SidleSamuel G. Alieyah Spader, M.D., F.A.C.C.

## 2013-03-11 NOTE — Assessment & Plan Note (Signed)
Clinically stable, no change made to current regimen including diuretic.

## 2013-03-11 NOTE — Patient Instructions (Signed)
Your physician recommends that you schedule a follow-up appointment in: 4 months. You will receive a reminder letter in the mail in about 2 months reminding you to call and schedule your appointment. If you don't receive this letter, please contact our office. Your physician recommends that you continue on your current medications as directed. Please refer to the Current Medication list given to you today. 

## 2013-03-25 ENCOUNTER — Ambulatory Visit (INDEPENDENT_AMBULATORY_CARE_PROVIDER_SITE_OTHER): Payer: Medicare Other | Admitting: *Deleted

## 2013-03-25 DIAGNOSIS — Z5181 Encounter for therapeutic drug level monitoring: Secondary | ICD-10-CM

## 2013-03-25 DIAGNOSIS — I4891 Unspecified atrial fibrillation: Secondary | ICD-10-CM

## 2013-03-25 DIAGNOSIS — Z7901 Long term (current) use of anticoagulants: Secondary | ICD-10-CM

## 2013-03-25 LAB — POCT INR: INR: 2.6

## 2013-04-11 ENCOUNTER — Ambulatory Visit (INDEPENDENT_AMBULATORY_CARE_PROVIDER_SITE_OTHER): Payer: Medicare Other | Admitting: *Deleted

## 2013-04-11 DIAGNOSIS — Z7901 Long term (current) use of anticoagulants: Secondary | ICD-10-CM

## 2013-04-11 DIAGNOSIS — Z5181 Encounter for therapeutic drug level monitoring: Secondary | ICD-10-CM

## 2013-04-11 DIAGNOSIS — I4891 Unspecified atrial fibrillation: Secondary | ICD-10-CM

## 2013-04-11 LAB — POCT INR: INR: 1.3

## 2013-06-20 ENCOUNTER — Telehealth: Payer: Self-pay

## 2013-06-20 NOTE — Telephone Encounter (Signed)
Patient past away per [facility]  °

## 2013-06-30 ENCOUNTER — Ambulatory Visit: Payer: Medicare Other | Admitting: Cardiology

## 2013-07-04 DEATH — deceased
